# Patient Record
Sex: Female | Born: 1965 | Race: White | Hispanic: No | Smoking: Current every day smoker
Health system: Southern US, Community
[De-identification: ages and names within clinical notes are randomized; demographics above are authoritative.]

## PROBLEM LIST (undated history)

## (undated) DIAGNOSIS — K8010 Calculus of gallbladder with chronic cholecystitis without obstruction: Secondary | ICD-10-CM

## (undated) DIAGNOSIS — K8050 Calculus of bile duct without cholangitis or cholecystitis without obstruction: Secondary | ICD-10-CM

## (undated) DIAGNOSIS — K8020 Calculus of gallbladder without cholecystitis without obstruction: Secondary | ICD-10-CM

## (undated) DIAGNOSIS — M1990 Unspecified osteoarthritis, unspecified site: Secondary | ICD-10-CM

## (undated) DIAGNOSIS — I10 Essential (primary) hypertension: Secondary | ICD-10-CM

## (undated) DIAGNOSIS — I499 Cardiac arrhythmia, unspecified: Secondary | ICD-10-CM

## (undated) DIAGNOSIS — F410 Panic disorder [episodic paroxysmal anxiety] without agoraphobia: Secondary | ICD-10-CM

## (undated) DIAGNOSIS — F32A Depression, unspecified: Secondary | ICD-10-CM

## (undated) DIAGNOSIS — K219 Gastro-esophageal reflux disease without esophagitis: Secondary | ICD-10-CM

## (undated) DIAGNOSIS — F329 Major depressive disorder, single episode, unspecified: Secondary | ICD-10-CM

## (undated) HISTORY — DX: Calculus of gallbladder with chronic cholecystitis without obstruction: K80.10

## (undated) HISTORY — PX: ABDOMINAL HYSTERECTOMY: SHX81

## (undated) HISTORY — DX: Calculus of bile duct without cholangitis or cholecystitis without obstruction: K80.50

---

## 1998-11-20 ENCOUNTER — Other Ambulatory Visit: Admission: RE | Admit: 1998-11-20 | Discharge: 1998-11-20 | Payer: Self-pay | Admitting: Obstetrics and Gynecology

## 2000-01-05 ENCOUNTER — Other Ambulatory Visit: Admission: RE | Admit: 2000-01-05 | Discharge: 2000-01-05 | Payer: Self-pay | Admitting: Obstetrics and Gynecology

## 2001-02-17 ENCOUNTER — Other Ambulatory Visit: Admission: RE | Admit: 2001-02-17 | Discharge: 2001-02-17 | Payer: Self-pay | Admitting: *Deleted

## 2002-03-28 ENCOUNTER — Other Ambulatory Visit: Admission: RE | Admit: 2002-03-28 | Discharge: 2002-03-28 | Payer: Self-pay | Admitting: Obstetrics and Gynecology

## 2003-04-10 ENCOUNTER — Other Ambulatory Visit: Admission: RE | Admit: 2003-04-10 | Discharge: 2003-04-10 | Payer: Self-pay | Admitting: Obstetrics and Gynecology

## 2004-04-23 ENCOUNTER — Other Ambulatory Visit: Admission: RE | Admit: 2004-04-23 | Discharge: 2004-04-23 | Payer: Self-pay | Admitting: Obstetrics and Gynecology

## 2005-03-16 ENCOUNTER — Ambulatory Visit (HOSPITAL_COMMUNITY): Admission: RE | Admit: 2005-03-16 | Discharge: 2005-03-16 | Payer: Self-pay | Admitting: Internal Medicine

## 2005-05-21 ENCOUNTER — Other Ambulatory Visit: Admission: RE | Admit: 2005-05-21 | Discharge: 2005-05-21 | Payer: Self-pay | Admitting: Obstetrics and Gynecology

## 2005-12-07 DIAGNOSIS — I10 Essential (primary) hypertension: Secondary | ICD-10-CM | POA: Insufficient documentation

## 2006-07-01 ENCOUNTER — Other Ambulatory Visit: Admission: RE | Admit: 2006-07-01 | Discharge: 2006-07-01 | Payer: Self-pay | Admitting: Obstetrics & Gynecology

## 2006-09-16 ENCOUNTER — Other Ambulatory Visit: Admission: RE | Admit: 2006-09-16 | Discharge: 2006-09-16 | Payer: Self-pay | Admitting: Obstetrics and Gynecology

## 2006-09-16 ENCOUNTER — Encounter (INDEPENDENT_AMBULATORY_CARE_PROVIDER_SITE_OTHER): Payer: Self-pay | Admitting: Specialist

## 2006-09-16 ENCOUNTER — Ambulatory Visit: Payer: Self-pay | Admitting: Obstetrics and Gynecology

## 2006-12-07 DIAGNOSIS — N63 Unspecified lump in unspecified breast: Secondary | ICD-10-CM

## 2007-09-08 ENCOUNTER — Other Ambulatory Visit: Admission: RE | Admit: 2007-09-08 | Discharge: 2007-09-08 | Payer: Self-pay | Admitting: Obstetrics and Gynecology

## 2007-10-13 ENCOUNTER — Ambulatory Visit: Payer: Self-pay | Admitting: Obstetrics & Gynecology

## 2007-10-17 ENCOUNTER — Ambulatory Visit (HOSPITAL_COMMUNITY): Admission: RE | Admit: 2007-10-17 | Discharge: 2007-10-17 | Payer: Self-pay | Admitting: Family Medicine

## 2007-10-27 ENCOUNTER — Ambulatory Visit: Payer: Self-pay | Admitting: Obstetrics & Gynecology

## 2007-10-28 ENCOUNTER — Ambulatory Visit (HOSPITAL_COMMUNITY): Admission: RE | Admit: 2007-10-28 | Discharge: 2007-10-28 | Payer: Self-pay | Admitting: Obstetrics and Gynecology

## 2007-11-02 ENCOUNTER — Encounter: Admission: RE | Admit: 2007-11-02 | Discharge: 2007-11-02 | Payer: Self-pay | Admitting: Obstetrics and Gynecology

## 2007-11-09 ENCOUNTER — Ambulatory Visit: Payer: Self-pay | Admitting: Obstetrics & Gynecology

## 2007-11-23 ENCOUNTER — Encounter (INDEPENDENT_AMBULATORY_CARE_PROVIDER_SITE_OTHER): Payer: Self-pay | Admitting: Diagnostic Radiology

## 2007-11-23 ENCOUNTER — Encounter: Admission: RE | Admit: 2007-11-23 | Discharge: 2007-11-23 | Payer: Self-pay | Admitting: Internal Medicine

## 2007-12-28 ENCOUNTER — Ambulatory Visit: Payer: Self-pay | Admitting: Gynecology

## 2008-02-10 ENCOUNTER — Ambulatory Visit: Payer: Self-pay | Admitting: Gynecology

## 2008-03-19 DIAGNOSIS — K219 Gastro-esophageal reflux disease without esophagitis: Secondary | ICD-10-CM

## 2008-04-06 LAB — CONVERTED CEMR LAB

## 2008-04-09 ENCOUNTER — Ambulatory Visit: Payer: Self-pay | Admitting: Gynecology

## 2008-04-09 ENCOUNTER — Encounter (INDEPENDENT_AMBULATORY_CARE_PROVIDER_SITE_OTHER): Payer: Self-pay | Admitting: Gynecology

## 2008-04-09 ENCOUNTER — Ambulatory Visit (HOSPITAL_COMMUNITY): Admission: RE | Admit: 2008-04-09 | Discharge: 2008-04-10 | Payer: Self-pay | Admitting: Gynecology

## 2008-05-11 ENCOUNTER — Ambulatory Visit: Payer: Self-pay | Admitting: Gynecology

## 2008-07-10 ENCOUNTER — Emergency Department (HOSPITAL_COMMUNITY): Admission: EM | Admit: 2008-07-10 | Discharge: 2008-07-10 | Payer: Self-pay | Admitting: Emergency Medicine

## 2008-07-17 DIAGNOSIS — E785 Hyperlipidemia, unspecified: Secondary | ICD-10-CM | POA: Insufficient documentation

## 2008-10-04 ENCOUNTER — Emergency Department (HOSPITAL_COMMUNITY): Admission: EM | Admit: 2008-10-04 | Discharge: 2008-10-04 | Payer: Self-pay | Admitting: Emergency Medicine

## 2008-10-17 ENCOUNTER — Ambulatory Visit: Payer: Self-pay | Admitting: Nurse Practitioner

## 2008-10-17 DIAGNOSIS — F17200 Nicotine dependence, unspecified, uncomplicated: Secondary | ICD-10-CM

## 2008-10-17 DIAGNOSIS — F410 Panic disorder [episodic paroxysmal anxiety] without agoraphobia: Secondary | ICD-10-CM | POA: Insufficient documentation

## 2008-10-17 LAB — CONVERTED CEMR LAB
Albumin: 4 g/dL (ref 3.5–5.2)
Alkaline Phosphatase: 43 units/L (ref 39–117)
BUN: 13 mg/dL (ref 6–23)
CO2: 25 meq/L (ref 19–32)
Eosinophils Absolute: 0.1 10*3/uL (ref 0.0–0.7)
Eosinophils Relative: 1 % (ref 0–5)
Glucose, Bld: 85 mg/dL (ref 70–99)
HCT: 42.8 % (ref 36.0–46.0)
Lymphocytes Relative: 41 % (ref 12–46)
Lymphs Abs: 3 10*3/uL (ref 0.7–4.0)
MCV: 103.9 fL — ABNORMAL HIGH (ref 78.0–100.0)
Monocytes Relative: 8 % (ref 3–12)
Potassium: 3.7 meq/L (ref 3.5–5.3)
RBC: 4.12 M/uL (ref 3.87–5.11)
Sodium: 141 meq/L (ref 135–145)
TSH: 0.59 microintl units/mL (ref 0.350–4.50)
Total Bilirubin: 0.5 mg/dL (ref 0.3–1.2)
Total Protein: 6.2 g/dL (ref 6.0–8.3)
WBC: 7.3 10*3/uL (ref 4.0–10.5)

## 2008-10-19 ENCOUNTER — Encounter (INDEPENDENT_AMBULATORY_CARE_PROVIDER_SITE_OTHER): Payer: Self-pay | Admitting: Nurse Practitioner

## 2008-10-24 ENCOUNTER — Encounter (INDEPENDENT_AMBULATORY_CARE_PROVIDER_SITE_OTHER): Payer: Self-pay | Admitting: Nurse Practitioner

## 2008-10-31 ENCOUNTER — Encounter (INDEPENDENT_AMBULATORY_CARE_PROVIDER_SITE_OTHER): Payer: Self-pay | Admitting: Nurse Practitioner

## 2008-11-13 ENCOUNTER — Encounter (INDEPENDENT_AMBULATORY_CARE_PROVIDER_SITE_OTHER): Payer: Self-pay | Admitting: Nurse Practitioner

## 2008-11-16 ENCOUNTER — Encounter (INDEPENDENT_AMBULATORY_CARE_PROVIDER_SITE_OTHER): Payer: Self-pay | Admitting: Nurse Practitioner

## 2008-12-10 ENCOUNTER — Ambulatory Visit: Payer: Self-pay | Admitting: Nurse Practitioner

## 2008-12-10 DIAGNOSIS — I4949 Other premature depolarization: Secondary | ICD-10-CM | POA: Insufficient documentation

## 2008-12-10 LAB — CONVERTED CEMR LAB
Cholesterol, target level: 200 mg/dL
LDL Goal: 130 mg/dL

## 2009-02-07 ENCOUNTER — Encounter (INDEPENDENT_AMBULATORY_CARE_PROVIDER_SITE_OTHER): Payer: Self-pay | Admitting: Nurse Practitioner

## 2009-02-07 ENCOUNTER — Ambulatory Visit: Payer: Self-pay | Admitting: *Deleted

## 2009-02-07 ENCOUNTER — Ambulatory Visit: Payer: Self-pay | Admitting: Nurse Practitioner

## 2009-02-07 DIAGNOSIS — B373 Candidiasis of vulva and vagina: Secondary | ICD-10-CM

## 2009-02-07 DIAGNOSIS — M26609 Unspecified temporomandibular joint disorder, unspecified side: Secondary | ICD-10-CM | POA: Insufficient documentation

## 2009-02-08 ENCOUNTER — Encounter (INDEPENDENT_AMBULATORY_CARE_PROVIDER_SITE_OTHER): Payer: Self-pay | Admitting: Nurse Practitioner

## 2009-04-20 ENCOUNTER — Emergency Department (HOSPITAL_COMMUNITY): Admission: EM | Admit: 2009-04-20 | Discharge: 2009-04-20 | Payer: Self-pay | Admitting: Family Medicine

## 2009-05-28 ENCOUNTER — Telehealth (INDEPENDENT_AMBULATORY_CARE_PROVIDER_SITE_OTHER): Payer: Self-pay | Admitting: Nurse Practitioner

## 2009-09-10 ENCOUNTER — Encounter (INDEPENDENT_AMBULATORY_CARE_PROVIDER_SITE_OTHER): Payer: Self-pay | Admitting: Nurse Practitioner

## 2010-01-02 ENCOUNTER — Encounter (INDEPENDENT_AMBULATORY_CARE_PROVIDER_SITE_OTHER): Payer: Self-pay | Admitting: Nurse Practitioner

## 2010-02-28 ENCOUNTER — Telehealth (INDEPENDENT_AMBULATORY_CARE_PROVIDER_SITE_OTHER): Payer: Self-pay | Admitting: Nurse Practitioner

## 2010-07-31 ENCOUNTER — Ambulatory Visit: Payer: Self-pay | Admitting: Nurse Practitioner

## 2010-07-31 DIAGNOSIS — M545 Low back pain: Secondary | ICD-10-CM | POA: Insufficient documentation

## 2010-07-31 DIAGNOSIS — M6740 Ganglion, unspecified site: Secondary | ICD-10-CM | POA: Insufficient documentation

## 2010-07-31 LAB — CONVERTED CEMR LAB
ALT: 14 units/L (ref 0–35)
Alkaline Phosphatase: 66 units/L (ref 39–117)
Basophils Absolute: 0 10*3/uL (ref 0.0–0.1)
CO2: 27 meq/L (ref 19–32)
Creatinine, Ser: 0.69 mg/dL (ref 0.40–1.20)
Eosinophils Absolute: 0.1 10*3/uL (ref 0.0–0.7)
Eosinophils Relative: 1 % (ref 0–5)
HCT: 43.9 % (ref 36.0–46.0)
Hemoglobin: 14.9 g/dL (ref 12.0–15.0)
Lymphocytes Relative: 34 % (ref 12–46)
MCHC: 33.9 g/dL (ref 30.0–36.0)
MCV: 100.9 fL — ABNORMAL HIGH (ref 78.0–100.0)
Monocytes Absolute: 0.5 10*3/uL (ref 0.1–1.0)
Platelets: 256 10*3/uL (ref 150–400)
RDW: 13.3 % (ref 11.5–15.5)
TSH: 1.484 microintl units/mL (ref 0.350–4.500)
Total Bilirubin: 0.4 mg/dL (ref 0.3–1.2)

## 2010-08-01 ENCOUNTER — Encounter (INDEPENDENT_AMBULATORY_CARE_PROVIDER_SITE_OTHER): Payer: Self-pay | Admitting: Nurse Practitioner

## 2010-10-28 ENCOUNTER — Ambulatory Visit: Payer: Self-pay | Admitting: Nurse Practitioner

## 2010-10-28 DIAGNOSIS — H6120 Impacted cerumen, unspecified ear: Secondary | ICD-10-CM

## 2010-10-29 ENCOUNTER — Encounter (INDEPENDENT_AMBULATORY_CARE_PROVIDER_SITE_OTHER): Payer: Self-pay | Admitting: Nurse Practitioner

## 2010-11-19 ENCOUNTER — Ambulatory Visit (HOSPITAL_COMMUNITY)
Admission: RE | Admit: 2010-11-19 | Discharge: 2010-11-19 | Payer: Self-pay | Source: Home / Self Care | Attending: Internal Medicine | Admitting: Internal Medicine

## 2010-11-26 ENCOUNTER — Encounter (INDEPENDENT_AMBULATORY_CARE_PROVIDER_SITE_OTHER): Payer: Self-pay | Admitting: Nurse Practitioner

## 2010-12-05 ENCOUNTER — Encounter
Admission: RE | Admit: 2010-12-05 | Discharge: 2010-12-05 | Payer: Self-pay | Source: Home / Self Care | Attending: Internal Medicine | Admitting: Internal Medicine

## 2010-12-28 ENCOUNTER — Encounter: Payer: Self-pay | Admitting: Internal Medicine

## 2011-01-04 LAB — CONVERTED CEMR LAB
Bilirubin Urine: NEGATIVE
Blood in Urine, dipstick: NEGATIVE
Chlamydia, DNA Probe: NEGATIVE
Cholesterol: 202 mg/dL — ABNORMAL HIGH (ref 0–200)
GC Probe Amp, Genital: NEGATIVE
Glucose, Urine, Semiquant: NEGATIVE
HDL: 43 mg/dL (ref >39)
Ketones, urine, test strip: NEGATIVE
LDL Cholesterol: 140 mg/dL — ABNORMAL HIGH (ref 0–99)
Nitrite: NEGATIVE
Pap Smear: NEGATIVE
Protein, U semiquant: NEGATIVE
Protein, U semiquant: NEGATIVE
Specific Gravity, Urine: >=1.03
Total CHOL/HDL Ratio: 4.7
Triglycerides: 96 mg/dL (ref <150)
Urobilinogen, UA: 0.2
VLDL: 19 mg/dL (ref 0–40)
WBC Urine, dipstick: NEGATIVE
pH: 5.5

## 2011-01-08 NOTE — Letter (Signed)
Summary: Lipid Letter  [facility]  [address] [place], [place] [address]   Phone: [phone]  Fax: [phone]    10/29/2010  [name] [address] [place], [place]  [address]  Dear [name]:  We have carefully reviewed your last lipid profile from 10/28/2010 and the results are noted below with a summary of recommendations for lipid management.    Cholesterol:       233     Goal: less than 200   HDL "good" Cholesterol:   35     Goal: greater than 40   LDL "bad" Cholesterol:   167     Goal: less than 1430   Triglycerides:       155     Goal: less than 150    Labs done during recent office visit show that your cholesterol is high. You should have spoken with this office about the need to start medications.  You will need labs rechecked in 8 weeks after starting medications  Pap Smear results __________________.    Current Medications: 1)    Lisinopril-hydrochlorothiazide 20-25 Mg Tabs (Lisinopril-hydrochlorothiazide) .... Take one tablet by mouth daily for blood pressure 2)    Atenolol 50 Mg Tabs (Atenolol) .Marland Kitchen.. 1 tablet by mouth daily for blood pressure 3)    Protonix 40 Mg Tbec (Pantoprazole sodium) .... One tablet by mouth daily for stomach 4)    Alprazolam 1 Mg Tabs (Alprazolam) .... Take 1/2 tablet by mouth three times a day as needed for nerves 5)    Lexapro 10 Mg Tabs (Escitalopram oxalate) .Marland Kitchen.. 1 tablet by mouth daily for mood 6)    Meloxicam 15 Mg Tabs (Meloxicam) .... One tablet by mouth daily for joint 7)    Pravastatin Sodium 20 Mg Tabs (Pravastatin sodium) .... One tablet by mouth nightly for cholesterol  If you have any questions, please call. We appreciate being able to work with you.   Sincerely,    [facility] Lehman Prom FNP

## 2011-01-08 NOTE — Letter (Signed)
Summary: VACCINE ADMINISTRATION RECORD  VACCINE ADMINISTRATION RECORD   Imported By: Arta Bruce 01/27/2010 15:41:46  _____________________________________________________________________  External Attachment:    Type:   Image     Comment:   External Document

## 2011-01-08 NOTE — Letter (Signed)
Summary: *HSN Results Follow up  [facility]  [address] [place], [place] [address]   Phone: [phone]  Fax: [phone]      08/01/2010   [name] [address] [place], [place]  [address]   Dear  [name],                            ____S.Drinkard,FNP   ____D. Gore,FNP       ____B. McPherson,MD   ____V. Rankins,MD    ____E. Mulberry,MD    __X__[name], FNP  ____D. Reche Dixon, MD    ____K. Philipp Deputy, MD    ____Other     This letter is to inform you that your recent test(s):  _______Pap Smear    ___X____Lab Test     _______X-ray    ___X____ is within acceptable limits  _______ requires a medication change  _______ requires a follow-up lab visit  _______ requires a follow-up visit with your provider   Comments: Labs done during recent office visit are normal.       _________________________________________________________ If you have any questions, please contact our office [phone].                    Sincerely,    Lehman Prom FNP [facility]

## 2011-01-08 NOTE — Miscellaneous (Signed)
Summary: Flu vaccine  Clinical Lists Changes  Observations: Added new observation of FLU VAX: historical (09/10/2009 8:42)

## 2011-01-08 NOTE — Progress Notes (Signed)
Summary: Office Visit//DEPRESSION SCREENING  Office Visit//DEPRESSION SCREENING   Imported By: Arta Bruce 10/29/2010 09:39:32  _____________________________________________________________________  External Attachment:    Type:   Image     Comment:   External Document

## 2011-01-08 NOTE — Assessment & Plan Note (Signed)
Summary: HTN/Anxiety   Vital Signs:  Patient profile:   45 year old female Menstrual status:  hysterectomy Weight:      249.0 pounds BMI:     38.56 Temp:     97.0 degrees F oral Pulse rate:   108 / minute Pulse rhythm:   regular Resp:     20 per minute BP sitting:   130 / 90  (left arm) Cuff size:   large  Vitals Entered By: Levon Hedger (July 31, 2010 3:32 PM)  Nutrition Counseling: Patient's BMI is greater than 25 and therefore counseled on weight management options. CC: renewing medication...pain in wrist x 2 -3 months, Hypertension Management, Lipid Management, Abdominal Pain, Depression Is Patient Diabetic? No Pain Assessment Patient in pain? no       Does patient need assistance? Functional Status Self care Ambulation Normal LMP - Character: hysterectomy     Menstrual Status hysterectomy Last PAP Result  Specimen Adequacy: Satisfactory for evaluation.   Interpretation/Result:Negative for intraepithelial Lesion or Malignancy.   Interpretation/Result:Fungal organisms c/w Candida present.       CC:  renewing medication...pain in wrist x 2 -3 months, Hypertension Management, Lipid Management, Abdominal Pain, and Depression.  History of Present Illness:  Pt into the office for f/u after not having been in this office in over 1 year. Her card has expired and she has finally gotten it renewed.  Social - Pt is recently separated from her husband who moved back to [place].  She is however in another relationship and may be planning to move to [place] with him in the future. Comments:  Admits that she was taking a family members medication since she was unable to get her medications from this office.  Depression Treatment History:  Prior Medication Used:   Start Date: Assessment of Effect:   Comments:  lexapro     07/31/2010   much improvement     restarted  Dyspepsia History:      She has no alarm features of dyspepsia including no history of melena,  hematochezia, dysphagia, persistent vomiting, or involuntary weight loss > 5%.  There is a prior history of GERD.  The patient does not have a prior history of documented ulcer disease.  The dominant symptom is heartburn or acid reflux.  An H-2 blocker medication is not currently being taken.  She has no history of a positive H. Pylori serology.  No previous upper endoscopy has been done.    Hypertension History:      She denies headache, chest pain, and palpitations.  She notes no problems with any antihypertensive medication side effects.        Positive major cardiovascular risk factors include hyperlipidemia, hypertension, and current tobacco user.  Negative major cardiovascular risk factors include female age less than 63 years old, no history of diabetes, and negative family history for ischemic heart disease.        Further assessment for target organ damage reveals no history of ASHD, stroke/TIA, peripheral vascular disease, renal insufficiency, or hypertensive retinopathy.    Lipid Management History:      Positive NCEP/ATP III risk factors include current tobacco user and hypertension.  Negative NCEP/ATP III risk factors include female age less than 6 years old, non-diabetic, no family history for ischemic heart disease, no ASHD (atherosclerotic heart disease), no prior stroke/TIA, no peripheral vascular disease, and no history of aortic aneurysm.        The patient states that she does not know about the "  Therapeutic Lifestyle Change" diet.        Habits & Providers  Alcohol-Tobacco-Diet     Alcohol drinks/day: 0     Tobacco Status: current     Cigarette Packs/Day: 1.0     Year Started: age 82  Exercise-Depression-Behavior     Does Patient Exercise: yes     Type of exercise: walking     Drug Use: no     Seat Belt Use: 100     Sun Exposure: occasionally  Allergies (verified): No Known Drug Allergies  Social History: Packs/Day:  1.0  Review of Systems General:  Denies  fever. CV:  Denies chest pain or discomfort. Resp:  Complains of cough; due to smoking. GI:  Complains of indigestion. GU:  Denies discharge. 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  Noticed a "little ball" on her wrist from time to time that is tender. Neuro:  Denies headaches.  Physical Exam  General:  alert, obese Head:  normocephalic.   Ears:  ear piercing(s) noted.   Lungs:  decreased bases Heart:  normal rate and regular rhythm.   Msk:  right shift - ganglion cyst Neurologic:  alert & oriented X3.     Impression & Recommendations:  Problem # 1:  HYPERTENSION, BENIGN ESSENTIAL (ICD-401.1) BP is stable reviewed DASH diet Her updated medication list for this problem includes:    Lisinopril-hydrochlorothiazide 20-25 Mg Tabs (Lisinopril-hydrochlorothiazide) .Marland Kitchen... Take one tablet by mouth daily for blood pressure    Atenolol 50 Mg Tabs (Atenolol) .Marland Kitchen... 1 tablet by mouth daily for blood pressure  Orders: Rapid HIV  ([address]) T-Comprehensive Metabolic Panel (82956-21308) T-CBC w/Diff (65784-69629) T-TSH (52841-32440)  Problem # 2:  PANIC DISORDER (ICD-300.01)  Her updated medication list for this problem includes:    Alprazolam 1 Mg Tabs (Alprazolam) .Marland Kitchen... Take 1/2 tablet by mouth three times a day as needed for nerves    Lexapro 10 Mg Tabs (Escitalopram oxalate) .Marland Kitchen... 1 tablet by mouth daily for mood  Problem # 3:  TOBACCO ABUSE (ICD-305.1) continued to advise cessation  Problem # 4:  GANGLION CYST, WRIST, RIGHT (ICD-727.41) advised pt of the dx  Complete Medication List: 1)  Lisinopril-hydrochlorothiazide 20-25 Mg Tabs (Lisinopril-hydrochlorothiazide) .... Take one tablet by mouth daily for blood pressure 2)  Atenolol 50 Mg Tabs (Atenolol) .Marland Kitchen.. 1 tablet by mouth daily for blood pressure 3)  Protonix 40 Mg Tbec (Pantoprazole sodium) .... One tablet by mouth daily for stomach 4)  Alprazolam 1 Mg Tabs (Alprazolam)  .... Take 1/2 tablet by mouth three times a day as needed for nerves 5)  Lexapro 10 Mg Tabs (Escitalopram oxalate) .Marland Kitchen.. 1 tablet by mouth daily for mood 6)  Meloxicam 15 Mg Tabs (Meloxicam) .... One tablet by mouth daily for joint  Hypertension Assessment/Plan:      The patient's hypertensive risk group is category B: At least one risk factor (excluding diabetes) with no target organ damage.  Her calculated 10 year risk of coronary heart disease is 11 %.  Today's blood pressure is 130/90.  Her blood pressure goal is < 140/90.  Lipid Assessment/Plan:      Based on NCEP/ATP III, the patient's risk factor category is "0-1 risk factors".  The patient's lipid goals are as follows: Total cholesterol goal is 200; LDL cholesterol goal is 130; HDL cholesterol goal is 40; Triglyceride goal is 150.    Patient Instructions: 1)  Your labs will be checked today. 2)  Schedule an appointment for a complete physical exam 3)  Come fasting for labs - no food after midnight before this visit. 4)  will need lipids, u/a, microalbumin, PAP, mammogram, EKG 5)  Lexapro and protonix will be sent to [facility] Prescriptions: ALPRAZOLAM 1 MG TABS (ALPRAZOLAM) Take 1/2 tablet by mouth three times a day as needed for nerves  #60 x 0   Entered and Authorized by:   Lehman Prom FNP   Signed by:   Lehman Prom FNP on 07/31/2010   Method used:   Printed then faxed to ...       [facility] (retail)       [address] [place], [place]  [address]       Ph: [phone]       Fax: [phone]   RxID:   [id number] LEXAPRO 10 MG TABS (ESCITALOPRAM OXALATE) 1 tablet by mouth daily for mood  #30 x 11   Entered and Authorized by:   Lehman Prom FNP   Signed by:   Lehman Prom FNP on 07/31/2010   Method used:   Faxed to ...       [facility] (retail)       [address] [place], [place]  [address]       Ph:  [phone]       Fax: [phone]   RxID:   [id number] PROTONIX 40 MG TBEC (PANTOPRAZOLE SODIUM) One tablet by mouth daily for stomach  #30 x 11   Entered and Authorized by:   Lehman Prom FNP   Signed by:   Lehman Prom FNP on 07/31/2010   Method used:   Faxed to ...       [facility] (retail)       [address] [place], [place]  [address]       Ph: [phone]       Fax: [phone]   RxID:   [id number] MELOXICAM 15 MG TABS (MELOXICAM) One tablet by mouth daily for joint  #30 x 5   Entered and Authorized by:   Lehman Prom FNP   Signed by:   Lehman Prom FNP on 07/31/2010   Method used:   Print then Give to Patient   RxID:   [id number] ALPRAZOLAM 1 MG TABS (ALPRAZOLAM) Take 1/2 tablet by mouth three times a day as needed for nerves  #60 x 0   Entered and Authorized by:   Lehman Prom FNP   Signed by:   Lehman Prom FNP on 07/31/2010   Method used:   Print then Give to Patient   RxID:   [id number] ATENOLOL 50 MG TABS (ATENOLOL) 1 tablet by mouth daily for blood pressure  #90 x 1   Entered and Authorized by:   Lehman Prom FNP   Signed by:   Lehman Prom FNP on 07/31/2010   Method used:   Electronically to        [facility] [address] (retail)       [phone]       [place], [place]  [address]       Ph: [phone]       Fax: [phone]   RxID:   [id number] LISINOPRIL-HYDROCHLOROTHIAZIDE 20-25 MG TABS (LISINOPRIL-HYDROCHLOROTHIAZIDE) Take one tablet by mouth daily for blood pressure  #  90 Each x 1   Entered and Authorized by:   Lehman Prom FNP   Signed by:   Lehman Prom FNP on 07/31/2010   Method used:   Electronically to        [facility] [address] (retail)       [phone]       [place], [place]  [address]       Ph: [phone]       Fax: [phone]   RxID:    [id number]   Appended Document: HTN/Anxiety     Allergies: No Known Drug Allergies   Complete Medication List: 1)  Lisinopril-hydrochlorothiazide 20-25 Mg Tabs (Lisinopril-hydrochlorothiazide) .... Take one tablet by mouth daily for blood pressure 2)  Atenolol 50 Mg Tabs (Atenolol) .Marland Kitchen.. 1 tablet by mouth daily for blood pressure 3)  Protonix 40 Mg Tbec (Pantoprazole sodium) .... One tablet by mouth daily for stomach 4)  Alprazolam 1 Mg Tabs (Alprazolam) .... Take 1/2 tablet by mouth three times a day as needed for nerves 5)  Lexapro 10 Mg Tabs (Escitalopram oxalate) .Marland Kitchen.. 1 tablet by mouth daily for mood 6)  Meloxicam 15 Mg Tabs (Meloxicam) .... One tablet by mouth daily for joint   Laboratory Results  Date/Time Received: August 01, 2010 12:49 PM   Other Tests  Rapid HIV: negative

## 2011-01-08 NOTE — Assessment & Plan Note (Signed)
Summary: Complete Physical Exam   Vital Signs:  Patient profile:   45 year old female Menstrual status:  hysterectomy Height:      67.50 inches Weight:      242.3 pounds Temp:     97.2 degrees F oral Pulse rate:   72 / minute Pulse rhythm:   regular Resp:     16 per minute BP sitting:   120 / 78  (left arm) Cuff size:   large  Vitals Entered By: Levon Hedger (October 28, 2010 10:55 AM) CC: CPP Is Patient Diabetic? No Pain Assessment Patient in pain? no       Does patient need assistance? Functional Status Self care Ambulation Normal   CC:  CPP.  History of Present Illness:  Pt into the office for a complete physical exam  PAP - last done in 2009 s/p partial  hysterecomy.  All previous PAP Smears have been normal. No cervical or ovarian cancer in the family.  Mammogram - last done in 2008 no family history of breast cancer no self breast exams at home  Optho - no recent optho exam. wears reading glasses from [employer]  Dental - no recent dental exam.   Obesity - down 7 pounds since last visit.  " i am not trying just eating less"  Habits & Providers  Alcohol-Tobacco-Diet     Alcohol drinks/day: 0     Tobacco Status: current     Tobacco Counseling: to quit use of tobacco products     Cigarette Packs/Day: 1.0     Year Started: age 1  Exercise-Depression-Behavior     Does Patient Exercise: yes     Type of exercise: walking     Have you felt down or hopeless? no     Have you felt little pleasure in things? no     Depression Counseling: not indicated; screening negative for depression     Drug Use: no     Seat Belt Use: 100     Sun Exposure: occasionally  Comments: PHQ-9 score = 6  Allergies (verified): No Known Drug Allergies  Review of Systems General:  Denies fever. Eyes:  Denies blurring. ENT:  Denies earache. CV:  Denies fatigue. Resp:  Denies cough. GI:  Denies abdominal pain, nausea, and vomiting. GU:  Denies discharge. MS:   Denies joint pain. Derm:  Denies itching. Neuro:  Denies headaches. Psych:  Denies depression.  Physical Exam  General:  alert.   Head:  normocephalic.   Eyes:  pupils equal, pupils round, and pupils reactive to light.   Ears:  ear piercing(s) noted.   right ear with moderate cerumen - soft left ear - able to see TM , clear fluid Nose:  no nasal discharge.   Mouth:  pharynx pink and moist and poor dentition.   Neck:  supple.   Chest Wall:  no mass.   Breasts:  no masses and no abnormal thickening.   Lungs:  normal breath sounds.   Heart:  normal rate and regular rhythm.   Abdomen:  obese Rectal:  no external abnormalities.   Genitalia:  right labia with cyst Msk:  up to the exam table Extremities:  no edema Neurologic:  alert & oriented X3, cranial nerves II-XII intact, and gait normal.   Skin:  color normal.   varicosities in bil lower ext Psych:  Oriented X3.    Pelvic Exam  Vulva:      normal appearance.   Urethra and Bladder:  Urethra--no discharge.  Bladder--normal.   Vagina:      physiologic discharge.   Cervix:      absent Uterus:      smooth.   Adnexa:      nontender bilaterally.   Rectum:      normal, heme negative stool.      Impression & Recommendations:  Problem # 1:  ROUTINE GYNECOLOGICAL EXAMINATION (ICD-V72.31) PAP done labs reviewed from previous visit rec optho and dental exam Orders: Hemoccult Guaiac-1 spec.(in office) (82270)  Problem # 2:  OTHER SCREENING BREAST EXAMINATION (ICD-V76.19) self breast exam placcard given to pt mammogram scheduled Orders: Mammogram (Screening) (Mammo)  Problem # 3:  HYPERLIPIDEMIA (ICD-272.4)  Orders: T-Lipid Profile (27253-66440)  Problem # 4:  HYPERTENSION, BENIGN ESSENTIAL (ICD-401.1)  Her updated medication list for this problem includes:    Lisinopril-hydrochlorothiazide 20-25 Mg Tabs (Lisinopril-hydrochlorothiazide) .Marland Kitchen... Take one tablet by mouth daily for blood pressure    Atenolol 50  Mg Tabs (Atenolol) .Marland Kitchen... 1 tablet by mouth daily for blood pressure  Orders: UA Dipstick w/o Micro (manual) (34742) EKG w/ Interpretation (93000)  Problem # 5:  TOBACCO ABUSE (ICD-305.1) advise cessation  Problem # 6:  CERUMEN IMPACTION, RIGHT (ICD-380.4)  irrigation done today in office  Orders: Cerumen Impaction Removal (59563)  Complete Medication List: 1)  Lisinopril-hydrochlorothiazide 20-25 Mg Tabs (Lisinopril-hydrochlorothiazide) .... Take one tablet by mouth daily for blood pressure 2)  Atenolol 50 Mg Tabs (Atenolol) .Marland Kitchen.. 1 tablet by mouth daily for blood pressure 3)  Protonix 40 Mg Tbec (Pantoprazole sodium) .... One tablet by mouth daily for stomach 4)  Alprazolam 1 Mg Tabs (Alprazolam) .... Take 1/2 tablet by mouth three times a day as needed for nerves 5)  Lexapro 10 Mg Tabs (Escitalopram oxalate) .Marland Kitchen.. 1 tablet by mouth daily for mood 6)  Meloxicam 15 Mg Tabs (Meloxicam) .... One tablet by mouth daily for joint  Patient Instructions: 1)  Your labs will be checked and you will be notified of the results 2)  Keep your appointment for a mammogram 3)  Your ear has been cleaned today.  Remember to not put anything in your ears. 4)  Follow up in 6 months or sooner if necessary.   Orders Added: 1)  T-Lipid Profile [80061-22930] 2)  Hemoccult Guaiac-1 spec.(in office) [82270] 3)  UA Dipstick w/o Micro (manual) [81002] 4)  EKG w/ Interpretation [93000] 5)  Est. Patient age 24-64 [69] 6)  Mammogram (Screening) [Mammo] 7)  Cerumen Impaction Removal [69210]    Laboratory Results   Urine Tests  Date/Time Received: October 28, 2010 11:39 AM   Routine Urinalysis   Color: lt. yellow Appearance: Clear Glucose: negative   (Normal Range: Negative) Bilirubin: negative   (Normal Range: Negative) Ketone: negative   (Normal Range: Negative) Spec. Gravity: >=1.030   (Normal Range: 1.003-1.035) Blood: negative   (Normal Range: Negative) pH: 5.5   (Normal Range:  5.0-8.0) Protein: negative   (Normal Range: Negative) Urobilinogen: 0.2   (Normal Range: 0-1) Nitrite: negative   (Normal Range: Negative) Leukocyte Esterace: negative   (Normal Range: Negative)    Date/Time Received: October 28, 2010 12:19 PM   [place]/KOH Source: vaginal WBC/hpf: 1-5 Bacteria/hpf: rare Clue cells/hpf: none Yeast/hpf: none Trichomonas/hpf: none  Stool - Occult Blood Hemmoccult #1: negative Date: 10/28/2010      Laboratory Results   Urine Tests    Routine Urinalysis   Color: lt. yellow Appearance: Clear Glucose: negative   (Normal Range: Negative) Bilirubin: negative   (Normal Range:  Negative) Ketone: negative   (Normal Range: Negative) Spec. Gravity: >=1.030   (Normal Range: 1.003-1.035) Blood: negative   (Normal Range: Negative) pH: 5.5   (Normal Range: 5.0-8.0) Protein: negative   (Normal Range: Negative) Urobilinogen: 0.2   (Normal Range: 0-1) Nitrite: negative   (Normal Range: Negative) Leukocyte Esterace: negative   (Normal Range: Negative)      [place] KOH: Negative    Prevention & Chronic Care Immunizations   Influenza vaccine: given at work  (10/07/2010)    Tetanus booster: 12/08/2007: historical per pt    Pneumococcal vaccine: Not documented  Other Screening   Pap smear:  Specimen Adequacy: Satisfactory for evaluation.   Interpretation/Result:Negative for intraepithelial Lesion or Malignancy.   Interpretation/Result:Fungal organisms c/w Candida present.      (02/07/2009)   Pap smear action/deferral: PAP smear done  (02/07/2009)    Mammogram: per [facility] program  (10/08/2007)   Mammogram action/deferral: mammogram ordered  (02/07/2009)   Smoking status: current  (10/28/2010)   Smoking cessation counseling: yes  (02/07/2009)  Lipids   Total Cholesterol: 202  (02/07/2009)   LDL: 140  (02/07/2009)   LDL Direct: Not documented   HDL: 43  (02/07/2009)   Triglycerides: 96  (02/07/2009)    SGOT (AST):  12  (07/31/2010)   SGPT (ALT): 14  (07/31/2010)   Alkaline phosphatase: 66  (07/31/2010)   Total bilirubin: 0.4  (07/31/2010)  Hypertension   Last Blood Pressure: 120 / 78  (10/28/2010)   Serum creatinine: 0.69  (07/31/2010)   Serum potassium 4.3  (07/31/2010)  Self-Management Support :    Hypertension self-management support: Not documented    Lipid self-management support: Not documented

## 2011-01-08 NOTE — Progress Notes (Signed)
Summary: Needs office visit  Phone Note Outgoing Call   Summary of Call: notify pt that it has been 1 year since her last visit at which time she had a CPE. she needs to schedule an appt for CPE with labs during the month of April alprazolam refilled for March but since it has been 1 year since her last visit she will need to be seen for further refills Initial call taken by: Lehman Prom FNP,  February 28, 2010 2:24 PM

## 2011-01-08 NOTE — Letter (Signed)
Summary: Handout Printed  Printed Handout:  - Ganglion

## 2011-01-18 ENCOUNTER — Inpatient Hospital Stay (INDEPENDENT_AMBULATORY_CARE_PROVIDER_SITE_OTHER)
Admission: RE | Admit: 2011-01-18 | Discharge: 2011-01-18 | Disposition: A | Discharge status: Home / Self Care | Payer: Self-pay | Source: Ambulatory Visit | Attending: Emergency Medicine | Admitting: Emergency Medicine

## 2011-01-18 DIAGNOSIS — R21 Rash and other nonspecific skin eruption: Secondary | ICD-10-CM

## 2011-01-18 DIAGNOSIS — B354 Tinea corporis: Secondary | ICD-10-CM

## 2011-04-07 ENCOUNTER — Emergency Department (HOSPITAL_COMMUNITY)
Admission: EM | Admit: 2011-04-07 | Discharge: 2011-04-08 | Disposition: A | Discharge status: Home / Self Care | Payer: Self-pay | Attending: Emergency Medicine | Admitting: Emergency Medicine

## 2011-04-07 DIAGNOSIS — I10 Essential (primary) hypertension: Secondary | ICD-10-CM | POA: Insufficient documentation

## 2011-04-07 DIAGNOSIS — W5311XA Bitten by rat, initial encounter: Secondary | ICD-10-CM | POA: Insufficient documentation

## 2011-04-07 DIAGNOSIS — Y929 Unspecified place or not applicable: Secondary | ICD-10-CM | POA: Insufficient documentation

## 2011-04-07 DIAGNOSIS — K219 Gastro-esophageal reflux disease without esophagitis: Secondary | ICD-10-CM | POA: Insufficient documentation

## 2011-04-07 DIAGNOSIS — M79609 Pain in unspecified limb: Secondary | ICD-10-CM | POA: Insufficient documentation

## 2011-04-07 DIAGNOSIS — F411 Generalized anxiety disorder: Secondary | ICD-10-CM | POA: Insufficient documentation

## 2011-04-07 DIAGNOSIS — Z79899 Other long term (current) drug therapy: Secondary | ICD-10-CM | POA: Insufficient documentation

## 2011-04-07 DIAGNOSIS — S61209A Unspecified open wound of unspecified finger without damage to nail, initial encounter: Secondary | ICD-10-CM | POA: Insufficient documentation

## 2011-04-21 NOTE — Group Therapy Note (Signed)
NAME:  [name], PULICE NO.:  [id number]   MEDICAL RECORD NO.:  [id number]          PATIENT TYPE:  WOC   LOCATION:  [facility]                   FACILITY:  [facility]   PHYSICIAN:  Johnella Moloney, MD        DATE OF BIRTH:  Oct 06, 1966   DATE OF SERVICE:                                  CLINIC NOTE   CHIEF COMPLAINT:  Menometrorrhagia.   HISTORY OF PRESENT ILLNESS:  The patient is a 45 year old G0 with a  history of menometrorrhagia. The patient was last seen on October 13, 2007, for evaluation of this menometrorrhagia.  She is here to follow up  results. Of note, the patient was offered the choice regarding the need  for an endometrial biopsy, but the patient declined as an artifical  examination.  She was scheduled for a pelvic ultrasound and laboratory  studies.   PHYSICAL EXAMINATION:  Deferred.   STUDIES:  The patient had a hemoglobin of 15.7,  normal coagulation  studies. TSH was 0.683, Prolactin 14.3. APG negative. A pelvic  ultrasound showed an endometrial strip of 4 mm, normal uterus, normal  adnexa.   These results were discussed with the patient, who was reassured,  however, stressed the need for an endometrial biopsy to rule out any  hyperplasia or neoplasia.. The patient is unwilling to undergo  procedures today, but wants it to be rescheduled for another date.  In  the mean time, the patient continues to have some bleeding. She was  given prescription for Provera 10  mg p.o. b.i.d. for ten days. She will  return for her endometrial biopsy.           ______________________________  Johnella Moloney, MD     UD/MEDQ  D:  10/27/2007  T:  10/28/2007  Job:  [id number]

## 2011-04-21 NOTE — Op Note (Signed)
[name], [name]                ACCOUNT NO.:  [id number]   MEDICAL RECORD NO.:  [id number]          PATIENT TYPE:  OBV   LOCATION:  9318                          [facility]   PHYSICIAN:  Ginger Carne, MD  DATE OF BIRTH:  Aug 07, 1966   DATE OF PROCEDURE:  04/09/2008  DATE OF DISCHARGE:                               OPERATIVE REPORT   PREOPERATIVE DIAGNOSIS:  Menometrorrhagia.   POSTOPERATIVE DIAGNOSIS:  Menometrorrhagia.   PROCEDURE:  Total vaginal hysterectomy with preservation of both tubes  and ovaries.   SURGEON:  Ginger Carne, MD   ASSISTANT:  Javier Glazier. Rose, MD.   ESTIMATED BLOOD LOSS:  50 ml.   ANAESTHESIA:  General   COMPLICATIONS:  None immediate.   SPECIMEN:  Uterus and cervix to pathology.   OPERATIVE FINDINGS:  External genitalia, vulva, and vagina normal.  Cervix smooth without erosions or lesions.  The uterus appeared to be 8-  10 weeks in size.  Both ovaries and tubes appeared normal.   OPERATIVE PROCEDURE:  The patient was prepped and draped in usual  fashion and placed in the lithotomy position.  Betadine solution used  for antiseptic and the patient was catheterized prior to procedure.  After adequate general anesthesia, double-tooth tenaculum placed in the  anterior and posterior lips of the cervix.  Following this, 20 mL of  Marcaine with epinephrine was injected circumferentially around the  cervix.  2 cm of anterior and posterior vaginal epithelium were incised  transversely.  Peritoneal reflection was identified, opened without  injury to their respective organs.  Uterosacral-cardinal ligament  complex was clamped and ligated with 0 Vicryl suture.  This was then  extended to the uterine vasculature in a standard Richardson fashion  with its ascending branches.  At this point, the utero-ovarian ligaments  including tube and the round ligaments were clamped, cut, and ligated  with transfixation 0 Vicryl suture.  Bleeding points  hemostatically  checked.  No active bleeding noted.  Closure of the cuff with  separate angle sutures with 0 Vicryl suture and then running 0 Vicryl  from either end to midline in an interlocking manner.  Bleeding points  hemostatically checked.  No active bleeding noted in the cuff.  More  irrigation utilized removed.  The patient tolerated the procedure well,  returned to post anesthesia recovery room in excellent condition.      Ginger Carne, MD  Electronically Signed     SHB/MEDQ  D:  04/09/2008  T:  04/09/2008  Job:  [id number]

## 2011-04-21 NOTE — Discharge Summary (Signed)
[name], [name]                ACCOUNT NO.:  [id number]   MEDICAL RECORD NO.:  [id number]          PATIENT TYPE:  WOC   LOCATION:  W[facility]                          FACILITY:  [facility]   PHYSICIAN:  Ginger Carne, MD  DATE OF BIRTH:  May 16, 1966   DATE OF ADMISSION:  04/09/2008  DATE OF DISCHARGE:  04/10/2008                               DISCHARGE SUMMARY   REASON FOR HOSPITALIZATION:  Menometrorrhagia.   HOSPITAL PROCEDURE:  Total vaginal hysterectomy with preservation of  both tubes and ovaries.   FINAL DIAGNOSIS:  Menomerorrhagia   HOSPITAL COURSE:  This is a 45 year old Caucasian female who underwent  the aforementioned procedure on Apr 09, 2008.  The patient had been  appropriately worked up prior to said surgery. Intra-operative course  unremarkable.    Postoperatively, the patient had a hemoglobin of 14.0 and a creatinine  of 0.66.  She was afebrile.  Calves without tenderness.  Lungs were  clear.  Abdomen was soft.  She had scant vaginal discharge.   Discharged with routine postoperative instructions including contacting  the office for temperature elevation above 100.4 degrees Fahrenheit,  increasing abdominal pain, vaginal bleeding, GI, GU complaints, or any  other symptomatology.  She was requested to make an appointment at the  GYN clinic to be seen in 4 weeks.   DISCHARGE MEDICATIONS:  Discharge medications include Percocet 5/325 1-2  every 6-8 hours as needed for pain, #40, no refills.  She will continue  her home medications with lisinopril, hydrochlorothiazide 20/25 mg  daily, Pepcid AC over-the-counter as needed, Protonix 40 mg daily,  fluoxetine 20 mg daily, alprazolam 1 mg up to 3 times a day, Adderall  100 mg daily, 1 baby aspirin and vitamin.   All questions answered to the satisfactory of the patient.  The patient  verbalized understanding of same.      Ginger Carne, MD  Electronically Signed     SHB/MEDQ  D:  04/10/2008  T:  04/10/2008   Job:  [id number]

## 2011-04-21 NOTE — H&P (Signed)
[name], [name]                ACCOUNT NO.:  [id number]   MEDICAL RECORD NO.:  [id number]         PATIENT TYPE:  WOBV   LOCATION:                                [facility]   PHYSICIAN:  Ginger Carne, MD  DATE OF BIRTH:  22-May-1966   DATE OF ADMISSION:  04/09/2008  DATE OF DISCHARGE:                              HISTORY & PHYSICAL   REASON FOR HOSPITALIZATION:  Menometrorrhagia.   HISTORY OF PRESENT ILLNESS:  This patient is a 45 year old gravida 0,  para 0 female who has had an over a 2-1/2-year history of  menometrorrhagia.  The patient states that her menses have lasted  anywhere from 2-3 weeks out of a 30-day cycle.  She was evaluated in  2007 with an endometrial biopsy which at that time demonstrated weakly  proliferative endometrium without neoplasia or hyperplasia.  On more  recent ultrasound of the pelvis dated October 17, 2007, demonstrated an  endometrial lining measuring 4 mm with a total uterine dimension  sagittally of 7.3 cm in length.  There was no evidence of adnexal  abnormalities and no evidence of myometrial masses.  The patient had  declined a repeat endometrial biopsy in November 2008.  The patient has  been treated with progesterone-only-pills in the past and this has not  resolved her bleeding.  She states that the bleeding had a negative  impact in her quality of life and at times has kept her from outdoor  activities.  She was offered a Mirena intrauterine device which she has  declined in addition to an endometrial ablation which she also had  refused.  She demonstrated on laboratory evaluation, a normal TSH and  prolactin level.  Her most recent hemoglobin on October 14, 2007, was  15.7 and her pregnancy test was negative at that time.  The patient  takes no medications to enhance her bleeding propensity and has no  personal family history of bleeding diathesis.  The patient also had  declined oral contraceptive management as well.  The patient  has  hypertension and is not a candidate for oral contraception.   OB/GYN HISTORY:  She has never been pregnant and does not use  contraception.   ALLERGIES:  None.   MEDICATIONS:  Prozac 20 mg daily; Xanax 0.5 mg up to three times a day;  lisinopril; hydrochlorothiazide actual dose unknown, one daily; atenolol  100 mg daily, atenolol one daily, Protonix 40 mg daily, one baby aspirin  81 mg daily, multivitamin, and vitamin D daily.   PAST MEDICAL HISTORY:  Depression, panic disorder, and hypertension.   PAST SURGICAL HISTORY:  Noncontributory.   SOCIAL HISTORY:  The patient smokes one and half packs of cigarettes a  day for over 30 years.  Denies illicit drug abuse or alcohol use.   FAMILY HISTORY:  She has no first-degree relatives with breast, colon,  ovarian, or uterine carcinoma.  Additionally, no first-degree relatives  with coronary artery heart disease or hypertension.   REVIEW OF SYSTEMS:  A 14-point comprehensive review of systems within  normal limits.   PHYSICAL EXAMINATION:  VITAL  SIGNS:  Blood pressure is 102/68, weight  230 pounds, and height 68 inches.  HEENT:  Grossly normal.  BREASTS:  Without masses, discharge, thickenings or tenderness.  CHEST:  Clear to percussion and auscultation.  CARDIOVASCULAR:  Without murmurs or enlargements.  Regular rate and  rhythm.  EXTREMITIES:  Within normal limits.  LYMPHATIC:  Within normal limits.  SKIN:  Within normal limits.  NEUROLOGICAL:  Within normal limits.  MUSCULOSKELETAL:  Within normal limits.  ABDOMEN:  Soft without gross hepatosplenomegaly.  PELVIC:  External genitalia, vulva, and vagina normal.  Cervix smooth  without erosions or lesions.  Uterus is 6 weeks in size and globular.  No tenderness and both adnexa palpable without masses or tenderness.   IMPRESSION:  Menometrorrhagia.   PLAN:  The patient has declined an endometrial ablation or Mirena  intrauterine device.  Due to hypertension, she is not a  candidate for  oral contraception.  Her bleeding has not improved on oral progesterone  management.  She has no desire for further childbearing and has no  medical or psychological contraindications which would outweigh the  benefits of surgical management.  She is class I [place] Cardiac  Classification.  A total vaginal hysterectomy with preservation of both  tubes and ovaries were discussed and accepted by the said patient.  Risks including possible injuries to ureter, bowel and bladder, possible  conversion to a laparoscopic or open procedure, hemorrhage requiring  blood transfusion, postoperative complications including infection,  bleeding, and pulmonary complications were discussed and understood by  the said patient.  The patient verbalized understanding of nature of  procedure, risks, benefits, and will undergo said surgery.      Ginger Carne, MD  Electronically Signed     SHB/MEDQ  D:  04/05/2008  T:  04/06/2008  Job:  [id number]

## 2011-04-21 NOTE — Group Therapy Note (Signed)
NAMECATHERINA, [name]                ACCOUNT NO.:  [id number]   MEDICAL RECORD NO.:  [id number]          PATIENT TYPE:  WOC   LOCATION:  [facility]                   [facility]ACILITY:  [facility]   PHYSICIAN:  Ginger Carne, MD DATE OF BIRTH:  1966/11/30   DATE OF SERVICE:  05/11/2008                                  CLINIC NOTE   [name] returns today for followup postoperative appointment following  a total vaginal hysterectomy, with preservation of both tubes and  ovaries, because of menometrorrhagia on Apr 09, 2008.  Essentially, she  has no specific gynecological postop complaints.  She does have rather  indistinct and diffuse complaints of upper abdominal discomfort  tightening and lack of appetite.  She is followed by the [facility]  and is on multiple medications including Prozac, Xanax, lisinopril,  atenolol, Protonix, and baby aspirin.  I think some of this may be  overriding issues in terms of depression and possibly bipolar disease.   SALIENT PHYSICAL FINDINGS:  Blood pressure is 102/73, weight 198 pounds,  height 68 inches, and pulse 82 and regular.  ABDOMEN:  Soft without gross hepatosplenomegaly.  External genitalia, vulva and vagina normal.  Cervix is absent.  Vaginal  cuff is smooth.  Both adnexa are palpable and found to be normal.  There  is no elicited tenderness on pelvic exam.   IMPRESSION:  Normal postoperative gynecological evaluation.   PLAN:  I asked the patient to follow up with the [facility] people in terms  of her abdominal generalized discomfort, and queasiness, and also lack  of appetite.  I am uncertain as to the nature of this, but I reassured  the patient this is not secondary to her surgery and has had these  issues in the past prior to her surgery.  She will follow up on a p.r.n.  basis.           ______________________________  Ginger Carne, MD     SHB/MEDQ  D:  05/11/2008  T:  05/12/2008  Job:  [id number]

## 2011-04-21 NOTE — Group Therapy Note (Signed)
NAME:  [name], [name] NO.:  [id number]   MEDICAL RECORD NO.:  [id number]          PATIENT TYPE:  WOC   LOCATION:  [facility]                   [facility]ILITY:  [facility]   PHYSICIAN:  Johnella Moloney, MD        DATE OF BIRTH:  [birth year]   DATE OF SERVICE:  10/13/2007                                  CLINIC NOTE   CHIEF COMPLAINT:  Menometrorrhagia.   HISTORY OF PRESENT ILLNESS:  The patient is a 45 year old G0, who was  self-referred for evaluation of menometrorrhagia. The patient had been  followed by another gynecologist but was referred to followup here,  given that she lost her health insurance. The patient reports having  menometrorrhagia for the last 2 years. She reports having monarchy at  age 38 with regular cycles, lasting about 5 days with moderate bleeding.  This continued until 2 years ago, when she started having her periods  irregularly, sometimes coming 2 to 3 times a month. In September of this  year, she reported bleeding throughout the whole month and in October,  she had her period two times and her last menstrual period was October 09, 2007. The patient does not report any pain with this period or any  other symptoms. Of note, she did have an endometrial biopsy in October  2007, which showed proliferative endometrium and no evidence of  hyperplasia. She had been treated with progesterone only pills in the  past and the patient is currently taking this without any resolution of  her symptoms.   REVIEW OF SYSTEMS:  On a 14 point review of systems, the patient reports  frequent weight loss, which she attributes to the recent loss of her  mother. She also reports headaches.   PAST MEDICAL HISTORY:  Arthritis, high blood pressure, lower back  problems.   PAST SURGICAL HISTORY:  None.   PAST OBSTETRIC HISTORY:  G0.   PAST GYNECOLOGIC HISTORY:  As above. The patient is not currently  sexually active and not on birth control pills. She had her last pap  smear September 2008, which was normal. She does report having abnormal  pap smear 12 years ago, for which she had colposcopy. No surgical  procedures.   SOCIAL HISTORY:  The patient smokes 1 1/2 packs per day. She has been  smoking for over 30 years. She denies any alcohol or illicit drugs.   FAMILY HISTORY:  The patient has an aunt with an unknown type of cancer.  She has another aunt with high blood pressure and 3 uncles who suffered  from heart attacks.   PHYSICAL EXAMINATION:  VITAL SIGNS:  Temperature 99.2, pulse 78, blood  pressure 125/88, weight 242 pounds. Height 68 inches.   The patient was counseled the need for an endometrial biopsy but the  patient declined this and other physical examination today.   ASSESSMENT/PLAN:  The patient is a 45 year old G0 with menometrorrhagia.  The patient was told the importance of getting an endometrial biopsy to  rule out  hyperplasia or neoplasia. She does understand the importance  of obtaining an endometrial biopsy but wants more  time to think about  it. She was also given the option of doing dilation and curettage in the  operating room, which the patient does not want to do at this time. As  part of preliminary workup, will order a pelvic ultrasound to evaluate  her uterus and endometrial line and would also check CBC, TSH,  prolactin, and HCG to rule out  any other reasons for her  menometrorrhagia. The patient will return in 2 weeks for a discussion of  these results and at this point, will decide whether to go ahead with an  endometrial biopsy or be scheduled for a dilation and curettage in the  operating room.           ______________________________  Johnella Moloney, MD     UD/MEDQ  D:  10/13/2007  T:  10/13/2007  Job:  [id number]

## 2011-05-22 ENCOUNTER — Inpatient Hospital Stay (INDEPENDENT_AMBULATORY_CARE_PROVIDER_SITE_OTHER)
Admission: RE | Admit: 2011-05-22 | Discharge: 2011-05-22 | Disposition: A | Discharge status: Home / Self Care | Payer: Self-pay | Source: Ambulatory Visit | Attending: Family Medicine | Admitting: Family Medicine

## 2011-05-22 DIAGNOSIS — K5289 Other specified noninfective gastroenteritis and colitis: Secondary | ICD-10-CM

## 2011-09-04 LAB — COMPREHENSIVE METABOLIC PANEL
ALT: 16
AST: 16
Albumin: 3.8
CO2: 26
Calcium: 9.2
Creatinine, Ser: 0.69
GFR calc Af Amer: >60
Sodium: 136
Total Protein: 6.9

## 2011-09-04 LAB — CBC
Platelets: 188
WBC: 8.3

## 2011-09-04 LAB — URINALYSIS, ROUTINE W REFLEX MICROSCOPIC
Nitrite: NEGATIVE
Protein, ur: NEGATIVE
Urobilinogen, UA: 1

## 2011-09-04 LAB — DIFFERENTIAL
Eosinophils Absolute: 0
Eosinophils Relative: 1
Lymphocytes Relative: 40
Lymphs Abs: 3.3
Monocytes Absolute: 0.4
Monocytes Relative: 5

## 2011-12-18 ENCOUNTER — Ambulatory Visit (HOSPITAL_COMMUNITY)
Admission: RE | Admit: 2011-12-18 | Discharge: 2011-12-18 | Disposition: A | Discharge status: Home / Self Care | Payer: Self-pay | Source: Ambulatory Visit | Attending: Internal Medicine | Admitting: Internal Medicine

## 2011-12-18 ENCOUNTER — Other Ambulatory Visit: Payer: Self-pay | Admitting: Internal Medicine

## 2011-12-18 DIAGNOSIS — R52 Pain, unspecified: Secondary | ICD-10-CM

## 2011-12-18 DIAGNOSIS — M542 Cervicalgia: Secondary | ICD-10-CM | POA: Insufficient documentation

## 2012-08-24 ENCOUNTER — Emergency Department (HOSPITAL_COMMUNITY): Payer: Self-pay

## 2012-08-24 ENCOUNTER — Emergency Department (HOSPITAL_COMMUNITY)
Admission: EM | Admit: 2012-08-24 | Discharge: 2012-08-24 | Disposition: A | Discharge status: Home / Self Care | Payer: Self-pay | Attending: Emergency Medicine | Admitting: Emergency Medicine

## 2012-08-24 ENCOUNTER — Encounter (HOSPITAL_COMMUNITY): Payer: Self-pay

## 2012-08-24 DIAGNOSIS — M1711 Unilateral primary osteoarthritis, right knee: Secondary | ICD-10-CM

## 2012-08-24 DIAGNOSIS — IMO0002 Reserved for concepts with insufficient information to code with codable children: Secondary | ICD-10-CM | POA: Insufficient documentation

## 2012-08-24 DIAGNOSIS — M1710 Unilateral primary osteoarthritis, unspecified knee: Secondary | ICD-10-CM | POA: Insufficient documentation

## 2012-08-24 HISTORY — DX: Major depressive disorder, single episode, unspecified: F32.9

## 2012-08-24 HISTORY — DX: Depression, unspecified: F32.A

## 2012-08-24 HISTORY — DX: Panic disorder (episodic paroxysmal anxiety): F41.0

## 2012-08-24 HISTORY — DX: Essential (primary) hypertension: I10

## 2012-08-24 MED ORDER — OXYCODONE-ACETAMINOPHEN 5-325 MG PO TABS: 2.0000 | ORAL_TABLET | ORAL | Status: DC | PRN

## 2012-08-24 NOTE — ED Notes (Signed)
Right leg pain 10/10 for a while but worsen recently, has limited her mobility

## 2012-08-24 NOTE — ED Notes (Signed)
Patient transported to X-ray 

## 2012-08-24 NOTE — Progress Notes (Signed)
Pt confirms no pcp self pay who was previously seen at [facility] She has not obtained a new pcp, has not obtained her medical records nor checked to see if her medications sent to [facility] referred pt to [phone] [facility] contact line Cm provided pt with written information for self pay pcp, medication and financial resources including pcp recommended by [facility] and [facility] contact information

## 2012-09-01 NOTE — ED Provider Notes (Signed)
Medical screening examination/treatment/procedure(s) were performed by non-physician practitioner and as supervising physician I was immediately available for consultation/collaboration.  Toy Baker, MD 09/01/12 [phone]

## 2012-09-20 ENCOUNTER — Emergency Department (HOSPITAL_COMMUNITY)
Admission: EM | Admit: 2012-09-20 | Discharge: 2012-09-20 | Disposition: A | Discharge status: Home / Self Care | Payer: Self-pay | Attending: Emergency Medicine | Admitting: Emergency Medicine

## 2012-09-20 ENCOUNTER — Encounter (HOSPITAL_COMMUNITY): Payer: Self-pay | Admitting: Emergency Medicine

## 2012-09-20 DIAGNOSIS — I10 Essential (primary) hypertension: Secondary | ICD-10-CM | POA: Insufficient documentation

## 2012-09-20 DIAGNOSIS — Z79899 Other long term (current) drug therapy: Secondary | ICD-10-CM | POA: Insufficient documentation

## 2012-09-20 DIAGNOSIS — K047 Periapical abscess without sinus: Secondary | ICD-10-CM | POA: Insufficient documentation

## 2012-09-20 MED ORDER — OXYCODONE-ACETAMINOPHEN 5-325 MG PO TABS: 2.0000 | ORAL_TABLET | Freq: Four times a day (QID) | ORAL | Status: DC | PRN

## 2012-09-20 MED ORDER — PENICILLIN V POTASSIUM 500 MG PO TABS: 1000.0000 mg | ORAL_TABLET | Freq: Two times a day (BID) | ORAL | Status: DC

## 2012-09-20 NOTE — ED Provider Notes (Signed)
History   This chart was scribed for Alice Horn, MD by Albertha Ghee Rifaie. This patient was seen in room TR07C/TR07C and the patient's care was started at 2:45PM.   CSN: [id number]  Arrival date & time 09/20/12  1232   None     Chief Complaint  Patient presents with  . Dental Pain     The history is provided by the patient. No language interpreter was used.    [name] is a 46 y.o. female who presents to the Emergency Department complaining of 2 weeks gradual onset, gradually worsening, sever left sided dental pain that is gets aggravated with eating. Pt denies confusion, chills, vomiting, and emesis. She repots having light fever but hasn't measured fever degree at home. Pt does not have a h/o chronic medical conditions. She is a current everyday smoker but denies alcohol use.   Past Medical History  Diagnosis Date  . Hypertension   . Panic disorder   . Depression     Past Surgical History  Procedure Date  . Abdominal hysterectomy     History reviewed. No pertinent family history.  History  Substance Use Topics  . Smoking status: Current Every Day Smoker  . Smokeless tobacco: Not on file  . Alcohol Use: No   No OB history provided  Review of Systems  10 Systems reviewed and all are negative for acute change except as noted in the HPI.  Allergies  Review of patient's allergies indicates no known allergies.  Home Medications   Current Outpatient Rx  Name Route Sig Dispense Refill  . ALPRAZOLAM 0.5 MG PO TABS Oral Take 0.5 mg by mouth 3 (three) times daily as needed. Anxiety    . ATENOLOL 50 MG PO TABS Oral Take 50 mg by mouth daily.    Marland Kitchen CITALOPRAM HYDROBROMIDE 20 MG PO TABS Oral Take 20 mg by mouth daily.    Marland Kitchen LISINOPRIL-HYDROCHLOROTHIAZIDE 20-25 MG PO TABS Oral Take 1 tablet by mouth daily.    . MELOXICAM 7.5 MG PO TABS Oral Take 7.5 mg by mouth daily.    . OXYCODONE-ACETAMINOPHEN 5-325 MG PO TABS Oral Take 2 tablets by mouth every 6 (six) hours  as needed for pain. 20 tablet 0  . PENICILLIN V POTASSIUM 500 MG PO TABS Oral Take 2 tablets (1,000 mg total) by mouth 2 (two) times daily. X 7 days 28 tablet 0    BP 120/80  Pulse 90  Temp 97.9 F (36.6 C) (Oral)  Resp 18  SpO2 98%  Physical Exam  Nursing note and vitals reviewed. Constitutional:       Awake, alert, nontoxic appearance.  HENT:  Head: Atraumatic.       No Trismus  Oropharynx non-injected  Tooth 15 tender to percussion with localized gingiva tenderness and swelling and no pus drainage.     Eyes: Right eye exhibits no discharge. Left eye exhibits no discharge.  Neck: Neck supple.  Pulmonary/Chest: Effort normal. She exhibits no tenderness.  Abdominal: Soft. There is no tenderness. There is no rebound.  Musculoskeletal: She exhibits no tenderness.       Baseline ROM, no obvious new focal weakness.  Neurological:       Mental status and motor strength appears baseline for patient and situation.  Skin: No rash noted.  Psychiatric: She has a normal mood and affect.    ED Course  Procedures (including critical care time)  Labs Reviewed - No data to display No results found.  DIAGNOSTIC STUDIES:  Oxygen Saturation is 98% on room air, normal by my interpretation.    COORDINATION OF CARE: 2:49PM Patient / Family / Caregiver informed of clinical course, understand medical decision-making process, and agree with plan.    1. Dental abscess       MDM   I personally performed the services described in this documentation, which was scribed in my presence. The recorded information has been reviewed and considered.     Alice Horn, MD 09/24/12 [phone]

## 2012-09-20 NOTE — ED Notes (Signed)
Pt c/o left sided dental pain x 2 weeks worse over last couple of days

## 2012-09-28 NOTE — ED Provider Notes (Signed)
History     CSN: [id number]  Arrival date & time 08/24/12  1446   First MD Initiated Contact with Patient 08/24/12 1659      No chief complaint on file.   (Consider location/radiation/quality/duration/timing/severity/associated sxs/prior treatment) HPI Comments: Patient is a 46 year old female who presents with right leg pain "for a while" with no known injury. The pain is mostly located in her knee and radiates down her right leg. The pain gradually started and progressively worsened since the onset. The pain is dull, aching, and severe. The pain is constant and made worse with movement and weight bearing activity. Patient has tried nothing for pain relief. No alleviating factors. Patient reports associated right knee swelling. Patient denies right knee bruising, wound, numbness/tingling, weakness/coolness, color changes of extremities.    Past Medical History  Diagnosis Date  . Hypertension   . Panic disorder   . Depression     Past Surgical History  Procedure Date  . Abdominal hysterectomy     No family history on file.  History  Substance Use Topics  . Smoking status: Current Every Day Smoker  . Smokeless tobacco: Not on file  . Alcohol Use: No    OB History    Grav Para Term Preterm Abortions TAB SAB Ect Mult Living                  Review of Systems  Musculoskeletal: Positive for joint swelling and arthralgias.  All other systems reviewed and are negative.    Allergies  Review of patient's allergies indicates no known allergies.  Home Medications   Current Outpatient Rx  Name Route Sig Dispense Refill  . ALPRAZOLAM 0.5 MG PO TABS Oral Take 0.5 mg by mouth 3 (three) times daily as needed. Anxiety    . ATENOLOL 50 MG PO TABS Oral Take 50 mg by mouth daily.    Marland Kitchen CITALOPRAM HYDROBROMIDE 20 MG PO TABS Oral Take 20 mg by mouth daily.    Marland Kitchen LISINOPRIL-HYDROCHLOROTHIAZIDE 20-25 MG PO TABS Oral Take 1 tablet by mouth daily.    . MELOXICAM 7.5 MG PO TABS Oral  Take 7.5 mg by mouth daily.    . OXYCODONE-ACETAMINOPHEN 5-325 MG PO TABS Oral Take 2 tablets by mouth every 6 (six) hours as needed for pain. 20 tablet 0  . PENICILLIN V POTASSIUM 500 MG PO TABS Oral Take 2 tablets (1,000 mg total) by mouth 2 (two) times daily. X 7 days 28 tablet 0    BP 127/80  Pulse 78  Temp 98.8 F (37.1 C) (Oral)  Resp 16  SpO2 97%  Physical Exam  Nursing note and vitals reviewed. Constitutional: She is oriented to person, place, and time. She appears well-developed and well-nourished. No distress.  HENT:  Head: Normocephalic and atraumatic.  Eyes: Conjunctivae normal and EOM are normal. Pupils are equal, round, and reactive to light.  Neck: Normal range of motion. Neck supple.  Cardiovascular: Normal rate, regular rhythm and intact distal pulses.  Exam reveals no gallop and no friction rub.   No murmur heard.      Sufficient cap refill of distal extremities.   Pulmonary/Chest: Effort normal and breath sounds normal. She has no wheezes. She has no rales. She exhibits no tenderness.  Abdominal: Soft. She exhibits no distension. There is no tenderness. There is no rebound.  Musculoskeletal: Normal range of motion.       Right knee ROM limited due to pain and swelling. Mild swelling of right knee  with tenderness to palpation mostly and the medial joint line. Left knee unaffected. No other joint pain or swelling.   Neurological: She is alert and oriented to person, place, and time. Coordination normal.       Speech is goal-oriented. Moves limbs without ataxia.   Skin: Skin is warm and dry. She is not diaphoretic.  Psychiatric: She has a normal mood and affect. Her behavior is normal.    ED Course  Procedures (including critical care time)  Labs Reviewed - No data to display No results found.   1. Osteoarthritis of right knee       MDM  Based on patient's xray, she has osteoarthritis of the right knee. No injury and no neurovascular compromise of distal  extremities. Patient will be discharged with Mobic and follow up with doctor for further evaluation.        Emilia Beck, PA-C 10/10/12 1043

## 2012-10-14 NOTE — ED Provider Notes (Signed)
Medical screening examination/treatment/procedure(s) were performed by non-physician practitioner and as supervising physician I was immediately available for consultation/collaboration.  Toy Baker, MD 10/14/12 [phone]

## 2012-12-10 ENCOUNTER — Encounter (HOSPITAL_COMMUNITY): Payer: Self-pay | Admitting: Nurse Practitioner

## 2012-12-10 ENCOUNTER — Emergency Department (HOSPITAL_COMMUNITY)
Admission: EM | Admit: 2012-12-10 | Discharge: 2012-12-10 | Disposition: A | Discharge status: Home / Self Care | Payer: Self-pay | Attending: Emergency Medicine | Admitting: Emergency Medicine

## 2012-12-10 DIAGNOSIS — R21 Rash and other nonspecific skin eruption: Secondary | ICD-10-CM | POA: Insufficient documentation

## 2012-12-10 DIAGNOSIS — I10 Essential (primary) hypertension: Secondary | ICD-10-CM | POA: Insufficient documentation

## 2012-12-10 DIAGNOSIS — Y939 Activity, unspecified: Secondary | ICD-10-CM | POA: Insufficient documentation

## 2012-12-10 DIAGNOSIS — X58XXXA Exposure to other specified factors, initial encounter: Secondary | ICD-10-CM | POA: Insufficient documentation

## 2012-12-10 DIAGNOSIS — F329 Major depressive disorder, single episode, unspecified: Secondary | ICD-10-CM | POA: Insufficient documentation

## 2012-12-10 DIAGNOSIS — F410 Panic disorder [episodic paroxysmal anxiety] without agoraphobia: Secondary | ICD-10-CM | POA: Insufficient documentation

## 2012-12-10 DIAGNOSIS — Y92009 Unspecified place in unspecified non-institutional (private) residence as the place of occurrence of the external cause: Secondary | ICD-10-CM | POA: Insufficient documentation

## 2012-12-10 DIAGNOSIS — Z79899 Other long term (current) drug therapy: Secondary | ICD-10-CM | POA: Insufficient documentation

## 2012-12-10 DIAGNOSIS — L02419 Cutaneous abscess of limb, unspecified: Secondary | ICD-10-CM | POA: Insufficient documentation

## 2012-12-10 DIAGNOSIS — L0390 Cellulitis, unspecified: Secondary | ICD-10-CM

## 2012-12-10 DIAGNOSIS — F17200 Nicotine dependence, unspecified, uncomplicated: Secondary | ICD-10-CM | POA: Insufficient documentation

## 2012-12-10 DIAGNOSIS — L03119 Cellulitis of unspecified part of limb: Secondary | ICD-10-CM | POA: Insufficient documentation

## 2012-12-10 DIAGNOSIS — Z8739 Personal history of other diseases of the musculoskeletal system and connective tissue: Secondary | ICD-10-CM | POA: Insufficient documentation

## 2012-12-10 DIAGNOSIS — F3289 Other specified depressive episodes: Secondary | ICD-10-CM | POA: Insufficient documentation

## 2012-12-10 HISTORY — DX: Unspecified osteoarthritis, unspecified site: M19.90

## 2012-12-10 MED ORDER — CEPHALEXIN 500 MG PO CAPS: 500.0000 mg | ORAL_CAPSULE | Freq: Four times a day (QID) | ORAL | Status: DC

## 2012-12-10 NOTE — ED Provider Notes (Signed)
History  This chart was scribed for Alice Razor, MD by Sofie Rower, ED Scribe. The patient was seen in room TR11C/TR11C and the patient's care was started at 4:03PM.       CSN: [id number]  Arrival date & time 12/10/12  1501   First MD Initiated Contact with Patient 12/10/12 1603      Chief Complaint  Patient presents with  . Blister    (Consider location/radiation/quality/duration/timing/severity/associated sxs/prior treatment) Patient is a 47 y.o. female presenting with rash and injury. The history is provided by the patient. No language interpreter was used.  Rash  This is a new problem. The current episode started more than 1 week ago. The problem has been gradually worsening. The problem is associated with nothing. There has been no fever. The rash is present on the right lower leg. The pain is moderate. The pain has been constant since onset. Associated symptoms include blisters. Pertinent negatives include no itching. She has tried nothing for the symptoms. The treatment provided no relief.  Injury  The incident occurred more than 2 days ago. The incident occurred at home. The injury mechanism is unknown. The context of the injury is unknown. There is an injury to the right lower leg (Blister to right lower leg.). The pain is moderate.    DONTAVIA BRAND is a 47 y.o. female , with a hx of Panic disorder, who presents to the Emergency Department complaining of sudden, progressively worsening, blister, located at the right anterior shin onset five days ago (12/05/12).  Associated symptoms include erythema located at the right anterior shin region. The pt reports she had a blister appear on her anterior right lower extremity, below the knee, last week. The blister burst open on Monday, 12/05/12, and has proceeded to become red and swollen at present. The pt informs she is concerned that she may have been bit by a spider.   The pt denies fever and chills.   The pt is a current everyday  smoker, however, she does not drink alcohol.    Past Medical History  Diagnosis Date  . Hypertension   . Panic disorder   . Depression   . Arthritis     Past Surgical History  Procedure Date  . Abdominal hysterectomy     History reviewed. No pertinent family history.  History  Substance Use Topics  . Smoking status: Current Every Day Smoker  . Smokeless tobacco: Not on file  . Alcohol Use: No    OB History    Grav Para Term Preterm Abortions TAB SAB Ect Mult Living                  Review of Systems  Skin: Positive for wound. Negative for itching.  All other systems reviewed and are negative.    Allergies  Review of patient's allergies indicates no known allergies.  Home Medications   Current Outpatient Rx  Name  Route  Sig  Dispense  Refill  . ALPRAZOLAM 0.5 MG PO TABS   Oral   Take 0.5 mg by mouth 3 (three) times daily as needed. Anxiety         . ATENOLOL 50 MG PO TABS   Oral   Take 50 mg by mouth daily.         Marland Kitchen CITALOPRAM HYDROBROMIDE 20 MG PO TABS   Oral   Take 20 mg by mouth daily.         Marland Kitchen LISINOPRIL-HYDROCHLOROTHIAZIDE 20-25 MG PO TABS  Oral   Take 1 tablet by mouth daily.         . MELOXICAM 7.5 MG PO TABS   Oral   Take 7.5 mg by mouth daily.         . OXYCODONE-ACETAMINOPHEN 5-325 MG PO TABS   Oral   Take 2 tablets by mouth every 6 (six) hours as needed for pain.   20 tablet   0   . PENICILLIN V POTASSIUM 500 MG PO TABS   Oral   Take 2 tablets (1,000 mg total) by mouth 2 (two) times daily. X 7 days   28 tablet   0     BP 138/72  Pulse 76  Temp 98 F (36.7 C) (Oral)  Resp 16  SpO2 97%  Physical Exam  Nursing note and vitals reviewed. Constitutional: She appears well-developed and well-nourished. No distress.  HENT:  Head: Normocephalic and atraumatic.  Eyes: Conjunctivae normal are normal. Right eye exhibits no discharge. Left eye exhibits no discharge.  Neck: Neck supple.  Cardiovascular: Normal rate,  regular rhythm and normal heart sounds.  Exam reveals no gallop and no friction rub.   No murmur heard. Pulmonary/Chest: Effort normal and breath sounds normal. No respiratory distress.  Abdominal: Soft. She exhibits no distension. There is no tenderness.  Musculoskeletal: She exhibits no edema and no tenderness.       Right anterior shin: dime sized circular region. Erythematous porter with some clearing towards the center. Flat. No drainage. Some mild increased warmth and erythema extending appoximately 2 cm.   Neurological: She is alert.  Skin: Skin is warm and dry.  Psychiatric: She has a normal mood and affect. Her behavior is normal. Thought content normal.    ED Course  Procedures (including critical care time)  DIAGNOSTIC STUDIES: Oxygen Saturation is 97% on room air, normal by my interpretation.    COORDINATION OF CARE:  4:18 PM- Treatment plan discussed with patient. Pt agrees with treatment.      Labs Reviewed - No data to display No results found.   1. Cellulitis       MDM  47 year old female with a small lesion to her right shin. This appears to be secondarily infected with a small amount of surrounding cellulitis. There is no drainage. No fluctuance. Patient is afebrile and clinically well appearing. No symptoms of systemic illness. Not immunosuppressed. Plan course of antibiotics. Emergent     I personally preformed the services scribed in my presence. The recorded information has been reviewed is accurate. Alice Razor, MD.   Alice Razor, MD 12/13/12 [phone]

## 2012-12-10 NOTE — ED Notes (Signed)
Pt reports she noticed a blister to R anterior leg below knee last week,. Then teh blister popped and now with a reddened area of infection at site

## 2013-02-16 ENCOUNTER — Emergency Department (HOSPITAL_BASED_OUTPATIENT_CLINIC_OR_DEPARTMENT_OTHER): Payer: Self-pay

## 2013-02-16 ENCOUNTER — Encounter (HOSPITAL_BASED_OUTPATIENT_CLINIC_OR_DEPARTMENT_OTHER): Payer: Self-pay | Admitting: *Deleted

## 2013-02-16 ENCOUNTER — Emergency Department (HOSPITAL_BASED_OUTPATIENT_CLINIC_OR_DEPARTMENT_OTHER)
Admission: EM | Admit: 2013-02-16 | Discharge: 2013-02-16 | Disposition: A | Discharge status: Home / Self Care | Payer: Self-pay | Attending: Emergency Medicine | Admitting: Emergency Medicine

## 2013-02-16 DIAGNOSIS — F3289 Other specified depressive episodes: Secondary | ICD-10-CM | POA: Insufficient documentation

## 2013-02-16 DIAGNOSIS — Z79899 Other long term (current) drug therapy: Secondary | ICD-10-CM | POA: Insufficient documentation

## 2013-02-16 DIAGNOSIS — F329 Major depressive disorder, single episode, unspecified: Secondary | ICD-10-CM | POA: Insufficient documentation

## 2013-02-16 DIAGNOSIS — F410 Panic disorder [episodic paroxysmal anxiety] without agoraphobia: Secondary | ICD-10-CM | POA: Insufficient documentation

## 2013-02-16 DIAGNOSIS — F17200 Nicotine dependence, unspecified, uncomplicated: Secondary | ICD-10-CM | POA: Insufficient documentation

## 2013-02-16 DIAGNOSIS — N390 Urinary tract infection, site not specified: Secondary | ICD-10-CM | POA: Insufficient documentation

## 2013-02-16 DIAGNOSIS — Z791 Long term (current) use of non-steroidal anti-inflammatories (NSAID): Secondary | ICD-10-CM | POA: Insufficient documentation

## 2013-02-16 DIAGNOSIS — I10 Essential (primary) hypertension: Secondary | ICD-10-CM | POA: Insufficient documentation

## 2013-02-16 DIAGNOSIS — M129 Arthropathy, unspecified: Secondary | ICD-10-CM | POA: Insufficient documentation

## 2013-02-16 DIAGNOSIS — K759 Inflammatory liver disease, unspecified: Secondary | ICD-10-CM | POA: Insufficient documentation

## 2013-02-16 DIAGNOSIS — R17 Unspecified jaundice: Secondary | ICD-10-CM | POA: Insufficient documentation

## 2013-02-16 LAB — COMPREHENSIVE METABOLIC PANEL
ALT: 341 U/L — ABNORMAL HIGH (ref 0–35)
AST: 168 U/L — ABNORMAL HIGH (ref 0–37)
Alkaline Phosphatase: 200 U/L — ABNORMAL HIGH (ref 39–117)
CO2: 23 mEq/L (ref 19–32)
Chloride: 103 mEq/L (ref 96–112)
GFR calc Af Amer: >90 mL/min (ref >90)
GFR calc non Af Amer: >90 mL/min (ref >90)
Glucose, Bld: 104 mg/dL — ABNORMAL HIGH (ref 70–99)
Potassium: 3.3 mEq/L — ABNORMAL LOW (ref 3.5–5.1)
Sodium: 140 mEq/L (ref 135–145)
Total Bilirubin: 6.8 mg/dL — ABNORMAL HIGH (ref 0.3–1.2)

## 2013-02-16 LAB — CBC WITH DIFFERENTIAL/PLATELET
Basophils Absolute: 0 10*3/uL (ref 0.0–0.1)
HCT: 44.3 % (ref 36.0–46.0)
Lymphocytes Relative: 6 % — ABNORMAL LOW (ref 12–46)
Lymphs Abs: 0.4 10*3/uL — ABNORMAL LOW (ref 0.7–4.0)
MCV: 98.4 fL (ref 78.0–100.0)
Neutro Abs: 5.5 10*3/uL (ref 1.7–7.7)
Platelets: 141 10*3/uL — ABNORMAL LOW (ref 150–400)
RBC: 4.5 MIL/uL (ref 3.87–5.11)
RDW: 13 % (ref 11.5–15.5)
WBC: 6.4 10*3/uL (ref 4.0–10.5)

## 2013-02-16 LAB — URINE MICROSCOPIC-ADD ON

## 2013-02-16 LAB — URINALYSIS, ROUTINE W REFLEX MICROSCOPIC
Glucose, UA: NEGATIVE mg/dL
Hgb urine dipstick: NEGATIVE
Protein, ur: NEGATIVE mg/dL
Specific Gravity, Urine: 1.029 (ref 1.005–1.030)

## 2013-02-16 LAB — BILIRUBIN, DIRECT: Bilirubin, Direct: 5.2 mg/dL — ABNORMAL HIGH (ref 0.0–0.3)

## 2013-02-16 MED ORDER — CIPROFLOXACIN HCL 500 MG PO TABS: 500.0000 mg | ORAL_TABLET | Freq: Two times a day (BID) | ORAL | Status: DC

## 2013-02-16 NOTE — ED Notes (Signed)
Abdominal pain x 2 days. Nausea.  

## 2013-02-16 NOTE — ED Notes (Signed)
Pt. also c/o generalized body aches and fever since 02/14/13.

## 2013-02-16 NOTE — ED Notes (Signed)
Pt. states she forget to report a needle stick at her place of employment approx 8 weeks ago.  Unknown if needle was used or clean.  She reports all lab work collected to this point has been normal.

## 2013-02-16 NOTE — ED Provider Notes (Signed)
History     CSN: [id number]  Arrival date & time 02/16/13  1236   First MD Initiated Contact with Patient 02/16/13 1333      Chief Complaint  Patient presents with  . Abdominal Pain    (Consider location/radiation/quality/duration/timing/severity/associated sxs/prior treatment) HPI Comments: Patient presents with a two day history of generalized abd pain/cramping.  She has also noticed dark urine but no dysuria.  No fevers or chills.  Patient is a 47 y.o. female presenting with abdominal pain. The history is provided by the patient.  Abdominal Pain Pain location:  Generalized Pain quality: cramping   Pain radiates to:  Does not radiate Pain severity:  Moderate Onset quality:  Gradual Duration:  2 days Timing:  Intermittent Progression:  Worsening Chronicity:  New Context: not diet changes   Relieved by:  Nothing Worsened by:  Nothing tried   Past Medical History  Diagnosis Date  . Hypertension   . Panic disorder   . Depression   . Arthritis     Past Surgical History  Procedure Laterality Date  . Abdominal hysterectomy      No family history on file.  History  Substance Use Topics  . Smoking status: Current Every Day Smoker  . Smokeless tobacco: Not on file  . Alcohol Use: No    OB History   Grav Para Term Preterm Abortions TAB SAB Ect Mult Living                  Review of Systems  Gastrointestinal: Positive for abdominal pain.  All other systems reviewed and are negative.    Allergies  Review of patient's allergies indicates no known allergies.  Home Medications   Current Outpatient Rx  Name  Route  Sig  Dispense  Refill  . ALPRAZolam (XANAX) 0.5 MG tablet   Oral   Take 0.5 mg by mouth 3 (three) times daily. For anxiety         . Aspirin-Acetaminophen-Caffeine (EXCEDRIN EXTRA STRENGTH PO)   Oral   Take 2 tablets by mouth 2 (two) times daily as needed. For pain         . atenolol (TENORMIN) 50 MG tablet   Oral   Take 50 mg by  mouth daily.         . cephALEXin (KEFLEX) 500 MG capsule   Oral   Take 1 capsule (500 mg total) by mouth 4 (four) times daily.   30 capsule   0   . citalopram (CELEXA) 20 MG tablet   Oral   Take 20 mg by mouth daily.         Marland Kitchen lisinopril-hydrochlorothiazide (PRINZIDE,ZESTORETIC) 20-25 MG per tablet   Oral   Take 0.5 tablets by mouth daily.          . meloxicam (MOBIC) 7.5 MG tablet   Oral   Take 7.5 mg by mouth daily.           BP 140/82  Pulse 88  Temp(Src) 98.2 F (36.8 C) (Oral)  Resp 18  Wt 242 lb (109.77 kg)  BMI 37.32 kg/m2  SpO2 100%  Physical Exam  Nursing note and vitals reviewed. Constitutional: She appears well-developed and well-nourished. No distress.  HENT:  Head: Normocephalic and atraumatic.  Mouth/Throat: Oropharynx is clear and moist.  Neck: Normal range of motion. Neck supple.  Cardiovascular: Normal rate and regular rhythm.   No murmur heard. Pulmonary/Chest: Effort normal and breath sounds normal.  Abdominal: Soft. Bowel sounds are normal.  There  is ttp in the epigastric region with no rebound or guarding.  Bowel sounds are present and abdomen is obese, but non-distended.  Skin: She is not diaphoretic.    ED Course  Procedures (including critical care time)  Labs Reviewed  URINALYSIS, ROUTINE W REFLEX MICROSCOPIC - Abnormal; Notable for the following:    Color, Urine ORANGE (*)    APPearance CLOUDY (*)    Bilirubin Urine LARGE (*)    Ketones, ur >80 (*)    Nitrite POSITIVE (*)    Leukocytes, UA SMALL (*)    All other components within normal limits  URINE MICROSCOPIC-ADD ON - Abnormal; Notable for the following:    Squamous Epithelial / LPF FEW (*)    Bacteria, UA FEW (*)    All other components within normal limits  URINE CULTURE  CBC WITH DIFFERENTIAL  COMPREHENSIVE METABOLIC PANEL  LIPASE, BLOOD   No results found.   No diagnosis found.    MDM  The labs reveal hepatitis, the etiology of which I am unsure.  The  ultrasound shows stones in the gallbladder, however there is no evidence for obstruction.  She reports a needle stick at work about six weeks ago and I have added a hepatitis panel.  I discussed the case with Dr. Elnoria Howard from gastroenterology who can follow her up on Monday as she prefers not to be admitted.  She will be treated for what appears to be a uti, and will return prn if she worsens.  She will call Dr. Haywood Pao office to arrange follow up.          Geoffery Lyons, MD 02/16/13 [phone]

## 2013-02-17 LAB — URINE CULTURE: Colony Count: NO GROWTH

## 2013-02-17 LAB — HEPATITIS PANEL, ACUTE: HCV Ab: NEGATIVE

## 2013-02-20 ENCOUNTER — Telehealth (HOSPITAL_COMMUNITY): Payer: Self-pay | Admitting: Emergency Medicine

## 2013-02-21 ENCOUNTER — Emergency Department (HOSPITAL_COMMUNITY): Payer: Self-pay

## 2013-02-21 ENCOUNTER — Emergency Department (HOSPITAL_COMMUNITY)
Admission: EM | Admit: 2013-02-21 | Discharge: 2013-02-21 | Disposition: A | Discharge status: Home / Self Care | Payer: Self-pay | Attending: Emergency Medicine | Admitting: Emergency Medicine

## 2013-02-21 ENCOUNTER — Encounter (HOSPITAL_COMMUNITY): Payer: Self-pay | Admitting: *Deleted

## 2013-02-21 DIAGNOSIS — F410 Panic disorder [episodic paroxysmal anxiety] without agoraphobia: Secondary | ICD-10-CM | POA: Insufficient documentation

## 2013-02-21 DIAGNOSIS — I10 Essential (primary) hypertension: Secondary | ICD-10-CM | POA: Insufficient documentation

## 2013-02-21 DIAGNOSIS — K8020 Calculus of gallbladder without cholecystitis without obstruction: Secondary | ICD-10-CM | POA: Insufficient documentation

## 2013-02-21 DIAGNOSIS — K219 Gastro-esophageal reflux disease without esophagitis: Secondary | ICD-10-CM | POA: Insufficient documentation

## 2013-02-21 DIAGNOSIS — R110 Nausea: Secondary | ICD-10-CM | POA: Insufficient documentation

## 2013-02-21 DIAGNOSIS — F3289 Other specified depressive episodes: Secondary | ICD-10-CM | POA: Insufficient documentation

## 2013-02-21 DIAGNOSIS — K8050 Calculus of bile duct without cholangitis or cholecystitis without obstruction: Secondary | ICD-10-CM

## 2013-02-21 DIAGNOSIS — F17200 Nicotine dependence, unspecified, uncomplicated: Secondary | ICD-10-CM | POA: Insufficient documentation

## 2013-02-21 LAB — COMPREHENSIVE METABOLIC PANEL
ALT: 109 U/L — ABNORMAL HIGH (ref 0–35)
AST: 37 U/L (ref 0–37)
CO2: 30 mEq/L (ref 19–32)
Chloride: 103 mEq/L (ref 96–112)
Creatinine, Ser: 0.64 mg/dL (ref 0.50–1.10)
GFR calc non Af Amer: >90 mL/min (ref >90)
Glucose, Bld: 97 mg/dL (ref 70–99)
Total Bilirubin: 0.7 mg/dL (ref 0.3–1.2)

## 2013-02-21 LAB — CBC WITH DIFFERENTIAL/PLATELET
Basophils Absolute: 0 10*3/uL (ref 0.0–0.1)
HCT: 45.6 % (ref 36.0–46.0)
Hemoglobin: 15.2 g/dL — ABNORMAL HIGH (ref 12.0–15.0)
Lymphocytes Relative: 31 % (ref 12–46)
Lymphs Abs: 2.4 10*3/uL (ref 0.7–4.0)
Monocytes Absolute: 0.5 10*3/uL (ref 0.1–1.0)
Neutro Abs: 4.7 10*3/uL (ref 1.7–7.7)
RBC: 4.48 MIL/uL (ref 3.87–5.11)
RDW: 13.4 % (ref 11.5–15.5)
WBC: 7.6 10*3/uL (ref 4.0–10.5)

## 2013-02-21 LAB — URINALYSIS, ROUTINE W REFLEX MICROSCOPIC
Ketones, ur: NEGATIVE mg/dL
Leukocytes, UA: NEGATIVE
Nitrite: NEGATIVE
Specific Gravity, Urine: 1.03 (ref 1.005–1.030)
pH: 6 (ref 5.0–8.0)

## 2013-02-21 MED ORDER — MORPHINE SULFATE 4 MG/ML IJ SOLN: 4.0000 mg | Freq: Once | INTRAMUSCULAR | Status: DC

## 2013-02-21 MED ORDER — ONDANSETRON HCL 4 MG/2ML IJ SOLN: 4.0000 mg | Freq: Once | INTRAMUSCULAR | Status: DC

## 2013-02-21 NOTE — ED Provider Notes (Signed)
  Physical Exam  BP 129/78  Pulse 68  Temp(Src) 97.3 F (36.3 C) (Oral)  Resp 18  SpO2 99%  Physical Exam Reviewed Ultrasound with Dr. Ranae Palms and Dr. Daphine Deutscher wil discuss with GI as well  ED Course  Procedures At this time patient is pain free and requesting a meal encouraged to eat low fat diet  MDM Discharge pending GI discussion  Dr. Elnoria Howard agrees to see patient in office tomorrow morning      Arman Filter, NP 02/21/13 2334

## 2013-02-21 NOTE — ED Provider Notes (Signed)
History     CSN: [id number]  Arrival date & time 02/21/13  1604   First MD Initiated Contact with Patient 02/21/13 1705      Chief Complaint  Patient presents with  . Abdominal Pain    (Consider location/radiation/quality/duration/timing/severity/associated sxs/prior treatment) HPI [name] is a 47 y.o. female who presents to ED with complaint of abdominal pain. States pain started about a week ago. Intermittent. Associated with nausea. No fever, chills, vomiting, diarrhea. No chest pain. Was seen at [facility] 4 days ago, was told she may have hepatitis, had elevated LFTs. Pt was given a follow up with Dr. Elnoria Howard, who she was unable to see because could not pay the co pay. States also had hepatitis panel drawn, which all returned negative. Pt stats pain continues. States it is severe at times and eases off at times. Not taking any medications for this. Pain is mainly in the upper abdomen. Sharp. Nothing makes it better.     Past Medical History  Diagnosis Date  . Hypertension   . Panic disorder   . Depression   . Arthritis     Past Surgical History  Procedure Laterality Date  . Abdominal hysterectomy      History reviewed. No pertinent family history.  History  Substance Use Topics  . Smoking status: Current Every Day Smoker  . Smokeless tobacco: Not on file  . Alcohol Use: No    OB History   Grav Para Term Preterm Abortions TAB SAB Ect Mult Living                  Review of Systems  Constitutional: Negative for fever and chills.  Respiratory: Negative.   Cardiovascular: Negative.   Gastrointestinal: Positive for nausea and abdominal pain. Negative for vomiting, diarrhea and constipation.  Genitourinary: Negative for dysuria and flank pain.  Musculoskeletal: Negative.   Neurological: Negative for weakness and numbness.  All other systems reviewed and are negative.    Allergies  Review of patient's allergies indicates no known allergies.  Home Medications    Current Outpatient Rx  Name  Route  Sig  Dispense  Refill  . ALPRAZolam (XANAX) 0.5 MG tablet   Oral   Take 0.5 mg by mouth 3 (three) times daily. For anxiety         . atenolol (TENORMIN) 50 MG tablet   Oral   Take 50 mg by mouth daily.         . ciprofloxacin (CIPRO) 500 MG tablet   Oral   Take 500 mg by mouth 2 (two) times daily.         . citalopram (CELEXA) 20 MG tablet   Oral   Take 20 mg by mouth daily.         Marland Kitchen lisinopril-hydrochlorothiazide (PRINZIDE,ZESTORETIC) 20-25 MG per tablet   Oral   Take 0.5 tablets by mouth daily.            BP 154/88  Pulse 94  Temp(Src) 98.6 F (37 C) (Oral)  Resp 20  SpO2 95%  Physical Exam  Nursing note and vitals reviewed. Constitutional: She appears well-developed and well-nourished. No distress.  Neck: Neck supple.  Cardiovascular: Normal rate, regular rhythm and normal heart sounds.   Pulmonary/Chest: Effort normal and breath sounds normal. No respiratory distress. She has no wheezes. She has no rales.  Abdominal: Soft. Bowel sounds are normal. She exhibits no distension. There is tenderness. There is no rebound and no guarding.  RUQ tenderness  Musculoskeletal: She exhibits no edema.  Skin: Skin is warm.    ED Course  Procedures (including critical care time)  PT with RUQ tenderenss. US showed gallstones, elevated LFTs in ED few days ago. Unable to follow up, unable to pay copay. Will repeat blood tests. Discussed with Dr. Rubin Payor, who advised to repeat US to r/o cholecystitis.   Results for orders placed during the hospital encounter of 02/21/13  CBC WITH DIFFERENTIAL      Result Value Range   WBC 7.6  4.0 - 10.5 K/uL   RBC 4.48  3.87 - 5.11 MIL/uL   Hemoglobin 15.2 (*) 12.0 - 15.0 g/dL   HCT 11.9  14.7 - 82.9 %   MCV 101.8 (*) 78.0 - 100.0 fL   MCH 33.9  26.0 - 34.0 pg   MCHC 33.3  30.0 - 36.0 g/dL   RDW 56.2  13.0 - 86.5 %   Platelets 221  150 - 400 K/uL   Neutrophils Relative 61  43 - 77 %    Neutro Abs 4.7  1.7 - 7.7 K/uL   Lymphocytes Relative 31  12 - 46 %   Lymphs Abs 2.4  0.7 - 4.0 K/uL   Monocytes Relative 6  3 - 12 %   Monocytes Absolute 0.5  0.1 - 1.0 K/uL   Eosinophils Relative 1  0 - 5 %   Eosinophils Absolute 0.1  0.0 - 0.7 K/uL   Basophils Relative 0  0 - 1 %   Basophils Absolute 0.0  0.0 - 0.1 K/uL  COMPREHENSIVE METABOLIC PANEL      Result Value Range   Sodium 142  135 - 145 mEq/L   Potassium 3.3 (*) 3.5 - 5.1 mEq/L   Chloride 103  96 - 112 mEq/L   CO2 30  19 - 32 mEq/L   Glucose, Bld 97  70 - 99 mg/dL   BUN 14  6 - 23 mg/dL   Creatinine, Ser 7.84  0.50 - 1.10 mg/dL   Calcium 9.7  8.4 - 69.6 mg/dL   Total Protein 7.0  6.0 - 8.3 g/dL   Albumin 3.5  3.5 - 5.2 g/dL   AST 37  0 - 37 U/L   ALT 109 (*) 0 - 35 U/L   Alkaline Phosphatase 249 (*) 39 - 117 U/L   Total Bilirubin 0.7  0.3 - 1.2 mg/dL   GFR calc non Af Amer >90  >90 mL/min   GFR calc Af Amer >90  >90 mL/min  LIPASE, BLOOD      Result Value Range   Lipase 28  11 - 59 U/L  URINALYSIS, ROUTINE W REFLEX MICROSCOPIC      Result Value Range   Color, Urine YELLOW  YELLOW   APPearance CLEAR  CLEAR   Specific Gravity, Urine 1.030  1.005 - 1.030   pH 6.0  5.0 - 8.0   Glucose, UA NEGATIVE  NEGATIVE mg/dL   Hgb urine dipstick NEGATIVE  NEGATIVE   Bilirubin Urine NEGATIVE  NEGATIVE   Ketones, ur NEGATIVE  NEGATIVE mg/dL   Protein, ur NEGATIVE  NEGATIVE mg/dL   Urobilinogen, UA 0.2  0.0 - 1.0 mg/dL   Nitrite NEGATIVE  NEGATIVE   Leukocytes, UA NEGATIVE  NEGATIVE   US Abdomen Complete  02/16/2013  *RADIOLOGY REPORT*  Clinical Data:  2-day history of unspecified abdominal pain. Possible jaundice.  COMPLETE ABDOMINAL ULTRASOUND  Comparison:  None.  Findings:  Gallbladder:  Contracted, containing numerous shadowing gallstones. No visible gallbladder  wall thickening or pericholecystic fluid. Negative sonographic Murphy's sign according to the ultrasound technologist.  Common bile duct:  Normal in caliber with  maximum diameter approximating 4 mm.  Liver:  At least 2 calcified, shadowing granulomata, the largest approximating 8 mm in the medial segment left lobe.  No significant focal hepatic parenchymal abnormality.  Diffusely increased and coarsened echotexture. Patent portal vein with hepatopetal flow.  IVC:  Patent in its intrahepatic portion.  Obscured outside the liver by overlying bowel gas.  Pancreas:  Essentially obscured by overlying bowel gas.  Spleen:  7 mm calcified, shadowing granuloma near the hilum.  No significant focal splenic parenchymal abnormality.  Normal in size and parenchymal echotexture.  Right Kidney:  No hydronephrosis.  Well-preserved cortex.  No shadowing calculi.  Normal size and parenchymal echotexture without focal abnormalities.  Approximately 11.2 cm.  Left Kidney:  No hydronephrosis.  Well-preserved cortex.  No shadowing calculi.  Normal size and parenchymal echotexture without focal abnormalities.  Approximately 11.2 cm.  Abdominal aorta:  Normal in caliber proximally; obscured in its mid and distal portion by overlying bowel gas.  IMPRESSION:  1.  Contracted gallbladder containing numerous gallstones. Negative sonographic Murphy's sign.  No biliary ductal dilation. 2.  Diffuse hepatic steatosis without focal hepatic parenchymal abnormality. 3.  Calcified granulomata in the liver and spleen. 4.  Midline structures (extrahepatic IVC, pancreas, and mid and distal abdominal aorta) obscured by bowel gas and therefore not evaluated.   Original Report Authenticated By: Hulan Saas, M.D.    [place] pending. Pt's pain under control. Pt signed out with PA Manus Rudd.    No diagnosis found.    MDM          Lottie Mussel, PA-C 02/24/13 2351

## 2013-02-21 NOTE — ED Notes (Signed)
Pt c/o intermittent abd pain, reports was seen for same at [facility]. Reports still having pain and concerned with "gallbladder"

## 2013-02-21 NOTE — ED Notes (Signed)
Pt requesting that medications not be given at this time. Pt instructed to inform me when she is in need of medications.

## 2013-03-01 ENCOUNTER — Other Ambulatory Visit: Payer: Self-pay | Admitting: Gastroenterology

## 2013-03-03 ENCOUNTER — Encounter (HOSPITAL_COMMUNITY)
Admission: RE | Disposition: A | Discharge status: Home / Self Care | Payer: Self-pay | Source: Ambulatory Visit | Attending: Gastroenterology

## 2013-03-03 ENCOUNTER — Ambulatory Visit (HOSPITAL_COMMUNITY)
Admission: RE | Admit: 2013-03-03 | Discharge: 2013-03-03 | Disposition: A | Discharge status: Home / Self Care | Payer: Self-pay | Source: Ambulatory Visit | Attending: Gastroenterology | Admitting: Gastroenterology

## 2013-03-03 ENCOUNTER — Ambulatory Visit (HOSPITAL_COMMUNITY): Payer: Self-pay

## 2013-03-03 ENCOUNTER — Encounter (HOSPITAL_COMMUNITY): Payer: Self-pay | Admitting: *Deleted

## 2013-03-03 DIAGNOSIS — K219 Gastro-esophageal reflux disease without esophagitis: Secondary | ICD-10-CM | POA: Insufficient documentation

## 2013-03-03 DIAGNOSIS — R748 Abnormal levels of other serum enzymes: Secondary | ICD-10-CM | POA: Insufficient documentation

## 2013-03-03 DIAGNOSIS — F17200 Nicotine dependence, unspecified, uncomplicated: Secondary | ICD-10-CM | POA: Insufficient documentation

## 2013-03-03 DIAGNOSIS — I10 Essential (primary) hypertension: Secondary | ICD-10-CM | POA: Insufficient documentation

## 2013-03-03 DIAGNOSIS — K8070 Calculus of gallbladder and bile duct without cholecystitis without obstruction: Secondary | ICD-10-CM | POA: Insufficient documentation

## 2013-03-03 DIAGNOSIS — K838 Other specified diseases of biliary tract: Secondary | ICD-10-CM | POA: Insufficient documentation

## 2013-03-03 DIAGNOSIS — K5730 Diverticulosis of large intestine without perforation or abscess without bleeding: Secondary | ICD-10-CM | POA: Insufficient documentation

## 2013-03-03 DIAGNOSIS — R109 Unspecified abdominal pain: Secondary | ICD-10-CM | POA: Insufficient documentation

## 2013-03-03 HISTORY — DX: Gastro-esophageal reflux disease without esophagitis: K21.9

## 2013-03-03 HISTORY — DX: Calculus of gallbladder without cholecystitis without obstruction: K80.20

## 2013-03-03 HISTORY — PX: EUS: SHX5427

## 2013-03-03 HISTORY — PX: ERCP: SHX5425

## 2013-03-03 SURGERY — ERCP, WITH INTERVENTION IF INDICATED
Anesthesia: Moderate Sedation

## 2013-03-03 MED ORDER — PROMETHAZINE HCL 25 MG/ML IJ SOLN
INTRAMUSCULAR | Status: DC | PRN
  Administered 2013-03-03 (×2): 12.5 mg via INTRAVENOUS

## 2013-03-03 MED ORDER — GLUCAGON HCL (RDNA) 1 MG IJ SOLR
INTRAMUSCULAR | Status: AC
  Filled 2013-03-03: qty 3

## 2013-03-03 MED ORDER — PROMETHAZINE HCL 25 MG/ML IJ SOLN
INTRAMUSCULAR | Status: AC
  Filled 2013-03-03: qty 1

## 2013-03-03 MED ORDER — SODIUM CHLORIDE 0.9 % IV SOLN
INTRAVENOUS | Status: DC | PRN
  Administered 2013-03-03: 15:00:00

## 2013-03-03 MED ORDER — CIPROFLOXACIN IN D5W 400 MG/200ML IV SOLN
INTRAVENOUS | Status: AC
  Filled 2013-03-03: qty 200

## 2013-03-03 MED ORDER — SODIUM CHLORIDE 0.9 % IV SOLN
INTRAVENOUS | Status: DC
  Administered 2013-03-03: 500 mL via INTRAVENOUS

## 2013-03-03 MED ORDER — MIDAZOLAM HCL 10 MG/2ML IJ SOLN
INTRAMUSCULAR | Status: AC
  Filled 2013-03-03: qty 6

## 2013-03-03 MED ORDER — FENTANYL CITRATE 0.05 MG/ML IJ SOLN
INTRAMUSCULAR | Status: AC
  Filled 2013-03-03: qty 6

## 2013-03-03 MED ORDER — CIPROFLOXACIN IN D5W 400 MG/200ML IV SOLN
400.0000 mg | Freq: Once | INTRAVENOUS | Status: AC
  Administered 2013-03-03: 400 mg via INTRAVENOUS

## 2013-03-03 MED ORDER — FENTANYL CITRATE 0.05 MG/ML IJ SOLN
INTRAMUSCULAR | Status: DC | PRN
  Administered 2013-03-03 (×5): 25 ug via INTRAVENOUS

## 2013-03-03 MED ORDER — DIPHENHYDRAMINE HCL 50 MG/ML IJ SOLN
INTRAMUSCULAR | Status: AC
  Filled 2013-03-03: qty 1

## 2013-03-03 MED ORDER — MIDAZOLAM HCL 10 MG/2ML IJ SOLN
INTRAMUSCULAR | Status: DC | PRN
  Administered 2013-03-03 (×2): 2.5 mg via INTRAVENOUS
  Administered 2013-03-03: 2 mg via INTRAVENOUS
  Administered 2013-03-03 (×2): 2.5 mg via INTRAVENOUS

## 2013-03-03 MED ORDER — BUTAMBEN-TETRACAINE-BENZOCAINE 2-2-14 % EX AERO
INHALATION_SPRAY | CUTANEOUS | Status: DC | PRN
  Administered 2013-03-03: 2 via TOPICAL

## 2013-03-03 NOTE — H&P (Signed)
Reason for Consult: ? Choledocholithiasis and abdominal pain. Referring Physician: Cephus Shelling HPI: This is a 47 year old female with findings of cholelithiasis and possible choledocholithiasis.  She presented to the ER with complaints of abdominal pain and at that time she was noted to have a significantly elevated liver panel and cholelithiasis.  Her CBD at that time was only 4 mm.  She was sent home and then she represented to [facility] ER with similar complaints and her enzymes were noted to be declining, but her CBD was dilated at 1 cm.  Since her ER visit she denies any further issues with pain.  Past Medical History  Diagnosis Date  . Hypertension   . Panic disorder   . Depression   . Arthritis   . GERD (gastroesophageal reflux disease)   . Gallstones     Past Surgical History  Procedure Laterality Date  . Abdominal hysterectomy      History reviewed. No pertinent family history.  Social History:  reports that she has been smoking Cigarettes.  She has a 30 pack-year smoking history. She does not have any smokeless tobacco history on file. She reports that she does not drink alcohol or use illicit drugs.  Allergies: No Known Allergies  Medications:  Scheduled: . ciprofloxacin  400 mg Intravenous Once   Continuous: . sodium chloride 500 mL (03/03/13 1326)    No results found for this or any previous visit (from the past 24 hour(s)).   No results found.  ROS:  As stated above in the HPI otherwise negative.  Blood pressure 130/101, pulse 80, temperature 98.2 F (36.8 C), temperature source Oral, resp. rate 15, height 5' 7.5" (1.715 m), SpO2 96.00%.    PE: Gen: NAD, Alert and Oriented HEENT:  [place]/AT, EOMI Neck: Supple, no LAD Lungs: CTA Bilaterally CV: RRR without M/G/R ABM: Soft, NTND, +BS Ext: No C/C/E  Assessment/Plan: 1) ? Choledocholithiasis. 2) Cholelithiasis. 3) Abnormal liver enzymes.  Plan: 1) EUS/ERCP.  Jadavion Spoelstra D 03/03/2013, 1:59 PM

## 2013-03-03 NOTE — Op Note (Signed)
[facility] [address] [place], [address]   ENDOSCOPIC ULTRASOUND PROCEDURE REPORT  PATIENT: [name], [name]  MR#: [id number] BIRTHDATE: 16-Dec-1965  GENDER: Female ENDOSCOPIST: Jeani Hawking, MD REFERRED BY: PROCEDURE DATE:  03/03/2013 PROCEDURE:   Upper EUS/ERCP ASA CLASS:      Class III INDICATIONS:   1.  Abnormal CBD. MEDICATIONS: Versed 12 mg IV, Fentanyl 125 mcg IV, and Promethazine (Phenergan) 25 mg IV  DESCRIPTION OF PROCEDURE:   After the risks benefits and alternatives of the procedure were  explained, informed consent was obtained. The patient was then placed in the left, lateral, decubitus postion and IV sedation was administered. Throughout the procedure, the patients blood pressure, pulse and oxygen saturations were monitored continuously.  Under direct visualization, the     endoscope was introduced through the mouth and advanced to the second portion of the duodenum .  Water was used as necessary to provide an acoustic interface.  Upon completion of the imaging, water was removed and the patient was sent to the recovery room in satisfactory condition.      FINDINGS: With the EUS scope the CBD was well visualized as well as the cystic duct.  The CBD meassured 5-6 mm in the mid CBD.  In the distal CBD a stone was identified and at that time the procedure was converted to an ERCP.  The ampulla was visualized and spontaneous drainage of bile was noted.  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  The CBD was easily cannulated with the sphinterotome and the guidewire was secured in the right intrahepatic ducts.  A 1 cm sphincterotomy was created.  The extraction balloon was then used and after one sweep a large stone was extracted.  A final occlussion cholangiogram was negative for any retained stones.   The scope was then withdrawn from the patient and the procedure completed.  COMPLICATIONS: There were no complications. ENDOSCOPIC  VISUALIZATION:  ULTRASONIC VISUALIZATION:  STAGING:  ENDOSCOPIC IMPRESSION: 1) Successful extraction of a distal CBD stone.  RECOMMENDATIONS: 1) Surgical consultation for lap chole.   _______________________________ eSignedJeani Hawking, MD 03/03/2013 3:34 PM   CC:

## 2013-03-06 ENCOUNTER — Encounter (HOSPITAL_COMMUNITY): Payer: Self-pay | Admitting: Gastroenterology

## 2013-03-07 NOTE — ED Provider Notes (Signed)
Medical screening examination/treatment/procedure(s) were conducted as a shared visit with non-physician practitioner(s) and myself.  I personally evaluated the patient during the encounter   Venissa Nappi, MD 03/07/13 1555 

## 2013-03-07 NOTE — ED Provider Notes (Signed)
Medical screening examination/treatment/procedure(s) were conducted as a shared visit with non-physician practitioner(s) and myself.  I personally evaluated the patient during the encounter   Loren Racer, MD 03/07/13 1547

## 2013-03-13 ENCOUNTER — Encounter (HOSPITAL_COMMUNITY): Payer: Self-pay | Admitting: *Deleted

## 2013-03-13 ENCOUNTER — Ambulatory Visit (INDEPENDENT_AMBULATORY_CARE_PROVIDER_SITE_OTHER): Payer: Self-pay | Admitting: Surgery

## 2013-03-13 ENCOUNTER — Encounter (HOSPITAL_COMMUNITY): Payer: Self-pay | Admitting: Pharmacy Technician

## 2013-03-13 ENCOUNTER — Encounter (INDEPENDENT_AMBULATORY_CARE_PROVIDER_SITE_OTHER): Payer: Self-pay | Admitting: Surgery

## 2013-03-13 VITALS — BP 138/84 | HR 72 | Temp 97.3°F | Resp 16 | Ht 68.5 in | Wt 250.0 lb

## 2013-03-13 DIAGNOSIS — K8050 Calculus of bile duct without cholangitis or cholecystitis without obstruction: Secondary | ICD-10-CM

## 2013-03-13 DIAGNOSIS — K8010 Calculus of gallbladder with chronic cholecystitis without obstruction: Secondary | ICD-10-CM | POA: Insufficient documentation

## 2013-03-13 HISTORY — DX: Calculus of bile duct without cholangitis or cholecystitis without obstruction: K80.50

## 2013-03-13 HISTORY — DX: Calculus of gallbladder with chronic cholecystitis without obstruction: K80.10

## 2013-03-13 NOTE — Patient Instructions (Signed)
  [facility]  LAPAROSCOPIC SURGERY - POST-OP INSTRUCTIONS  Always review your discharge instruction sheet given to you by the facility where your surgery was performed.  A prescription for pain medication may be given to you upon discharge.  Take your pain medication as prescribed.  If narcotic pain medicine is not needed, then you may take acetaminophen (Tylenol) or ibuprofen (Advil) as needed.  Take your usually prescribed medications unless otherwise directed.  If you need a refill on your pain medication, please contact your pharmacy.  They will contact our office to request authorization. Prescriptions will not be filled after 5 P.M. or on weekends.  You should follow a light diet the first few days after arrival home, such as soup and crackers or toast.  Be sure to include plenty of fluids daily.  Most patients will experience some swelling and bruising in the area of the incisions.  Ice packs will help.  Swelling and bruising can take several days to resolve.   It is common to experience some constipation if taking pain medication after surgery.  Increasing fluid intake and taking a stool softener (such as Colace) will usually help or prevent this problem from occurring.  A mild laxative (Milk of Magnesia or Miralax) should be taken according to package instructions if there are no bowel movements after 48 hours.  Unless discharge instructions indicate otherwise, you may remove your bandages 24-48 hours after surgery, and you may shower at that time.  You may have steri-strips (small skin tapes) in place directly over the incision.  These strips should be left on the skin for 7-10 days.  If your surgeon used skin glue on the incision, you may shower in 24 hours.  The glue will flake off over the next 2-3 weeks.  Any sutures or staples will be removed at the office during your follow-up visit.  ACTIVITIES:  You may resume regular (light) daily activities beginning the  next day-such as daily self-care, walking, climbing stairs-gradually increasing activities as tolerated.  You may have sexual intercourse when it is comfortable.  Refrain from any heavy lifting or straining until approved by your doctor.  You may drive when you are no longer taking prescription pain medication, you can comfortably wear a seatbelt, and you can safely maneuver your car and apply brakes.  You should see your doctor in the office for a follow-up appointment approximately 2-3 weeks after your surgery.  Make sure that you call for this appointment within a day or two after you arrive home to insure a convenient appointment time.  WHEN TO CALL YOUR DOCTOR: 1. Fever over 101.0 2. Inability to urinate 3. Continued bleeding from incision 4. Increased pain, redness, or drainage from the incision 5. Increasing abdominal pain  The clinic staff is available to answer your questions during regular business hours.  Please don't hesitate to call and ask to speak to one of the nurses for clinical concerns.  If you have a medical emergency, go to the nearest emergency room or call 911.  A surgeon from [facility] is always on call for the hospital.  Robina Hamor M. Larayne Baxley, MD, FACS [facility] Office: [phone] Toll Free:  [phone] [phone]  Web site: [email or web address] 

## 2013-03-13 NOTE — Progress Notes (Signed)
General Surgery - [facility]  Chief Complaint  Patient presents with  . New Evaluation    gallstones, hx of CBD stone - for cholecystectomy - referral from Dr. Patrick Hung    HISTORY: Patient is a 47-year-old white female who developed abdominal pain approximately one month ago. Pain was intermittent. She was evaluated in the emergency department. Ultrasound demonstrated multiple gallstones. Patient had recurrent episodes of pain and further evaluation revealed abnormal liver function test. She was seen by gastroenterology and underwent ERCP with stone extraction on 03/03/2013. Patient is now referred for cholecystectomy for cholelithiasis and history of choledocholithiasis.  Patient has no prior history of hepatobiliary disease. She has had no prior abdominal surgery except for a vaginal hysterectomy.  Past Medical History  Diagnosis Date  . Hypertension   . Panic disorder   . Depression   . Arthritis   . GERD (gastroesophageal reflux disease)   . Gallstones      Current Outpatient Prescriptions  Medication Sig Dispense Refill  . ALPRAZolam (XANAX) 0.5 MG tablet Take 0.5 mg by mouth 3 (three) times daily. For anxiety      . atenolol (TENORMIN) 50 MG tablet Take 50 mg by mouth daily.      . citalopram (CELEXA) 20 MG tablet Take 20 mg by mouth daily.      . lisinopril-hydrochlorothiazide (PRINZIDE,ZESTORETIC) 20-25 MG per tablet Take 0.5 tablets by mouth daily.       . ranitidine (ZANTAC) 150 MG tablet Take 150 mg by mouth 2 (two) times daily.       No current facility-administered medications for this visit.     No Known Allergies   Family History  Problem Relation Age of Onset  . Hypertension Maternal Aunt      History   Social History  . Marital Status: Legally Separated    Spouse Name: N/A    Number of Children: N/A  . Years of Education: N/A   Social History Main Topics  . Smoking status: Current Every Day Smoker -- 1.00 packs/day for 30  years    Types: Cigarettes  . Smokeless tobacco: None  . Alcohol Use: No  . Drug Use: No  . Sexually Active: None   Other Topics Concern  . None   Social History Narrative  . None     REVIEW OF SYSTEMS - PERTINENT POSITIVES ONLY: Denies jaundice. Denies acholic stools. Denies previous abdominal surgery. Denies a hepatobiliary disease.  EXAM: Filed Vitals:   03/13/13 0937  BP: 138/84  Pulse: 72  Temp: 97.3 F (36.3 C)  Resp: 16    HEENT: normocephalic; pupils equal and reactive; sclerae clear; dentition good; mucous membranes moist NECK:  symmetric on extension; no palpable anterior or posterior cervical lymphadenopathy; no supraclavicular masses; no tenderness CHEST: clear to auscultation bilaterally without rales, rhonchi, or wheezes CARDIAC: regular rate and rhythm without significant murmur; peripheral pulses are full ABDOMEN: soft without distension; bowel sounds present; no mass; no hepatosplenomegaly; no hernia EXT:  non-tender without edema; no deformity NEURO: no gross focal deficits; no sign of tremor   LABORATORY RESULTS: See [facility] (CHL-Epic) for most recent results   RADIOLOGY RESULTS: See [facility] (CHL-Epic) for most recent results   IMPRESSION: #1 cholelithiasis, probable chronic cholecystitis #2 history of choledocholithiasis #3 morbid obesity  PLAN: The patient and I discussed the findings on her ultrasound and the results of her ERCP. I provided her with written literature to review the anatomy of the biliary tract and   I explained laparoscopic cholecystectomy. We discussed risk and benefits of the procedure including the possibility of conversion to open surgery. We discussed the hospital stay to be anticipated and the postoperative recovery. The patient would like to proceed with surgery in the near future.  The risks and benefits of the procedure have been discussed at length with the patient.  The patient understands the  proposed procedure, potential alternative treatments, and the course of recovery to be expected.  All of the patient's questions have been answered at this time.  The patient wishes to proceed with surgery.  Dresden Lozito M. Everado Pillsbury, MD, FACS General & Endocrine Surgery [facility]   Visit Diagnoses: 1. Cholelithiasis with cholecystitis   2. Choledocholithiasis, status post ERCP     Primary Care Physician: Default, Provider, MD   

## 2013-03-14 ENCOUNTER — Ambulatory Visit (HOSPITAL_COMMUNITY): Payer: Self-pay

## 2013-03-14 ENCOUNTER — Ambulatory Visit (HOSPITAL_COMMUNITY): Payer: Self-pay | Admitting: Anesthesiology

## 2013-03-14 ENCOUNTER — Observation Stay (HOSPITAL_COMMUNITY)
Admission: RE | Admit: 2013-03-14 | Discharge: 2013-03-15 | Disposition: A | Discharge status: Home / Self Care | Payer: Self-pay | Source: Ambulatory Visit | Attending: Surgery | Admitting: Surgery

## 2013-03-14 ENCOUNTER — Encounter (HOSPITAL_COMMUNITY)
Admission: RE | Disposition: A | Discharge status: Home / Self Care | Payer: Self-pay | Source: Ambulatory Visit | Attending: Surgery

## 2013-03-14 ENCOUNTER — Encounter (HOSPITAL_COMMUNITY): Payer: Self-pay

## 2013-03-14 ENCOUNTER — Encounter (HOSPITAL_COMMUNITY): Payer: Self-pay | Admitting: Anesthesiology

## 2013-03-14 DIAGNOSIS — K8010 Calculus of gallbladder with chronic cholecystitis without obstruction: Secondary | ICD-10-CM

## 2013-03-14 DIAGNOSIS — R7989 Other specified abnormal findings of blood chemistry: Secondary | ICD-10-CM | POA: Insufficient documentation

## 2013-03-14 DIAGNOSIS — Z79899 Other long term (current) drug therapy: Secondary | ICD-10-CM | POA: Insufficient documentation

## 2013-03-14 DIAGNOSIS — K219 Gastro-esophageal reflux disease without esophagitis: Secondary | ICD-10-CM | POA: Insufficient documentation

## 2013-03-14 DIAGNOSIS — K8060 Calculus of gallbladder and bile duct with cholecystitis, unspecified, without obstruction: Principal | ICD-10-CM | POA: Insufficient documentation

## 2013-03-14 DIAGNOSIS — I10 Essential (primary) hypertension: Secondary | ICD-10-CM | POA: Insufficient documentation

## 2013-03-14 DIAGNOSIS — K8064 Calculus of gallbladder and bile duct with chronic cholecystitis without obstruction: Secondary | ICD-10-CM | POA: Insufficient documentation

## 2013-03-14 HISTORY — DX: Cardiac arrhythmia, unspecified: I49.9

## 2013-03-14 HISTORY — PX: CHOLECYSTECTOMY: SHX55

## 2013-03-14 LAB — SURGICAL PCR SCREEN
MRSA, PCR: NEGATIVE
Staphylococcus aureus: NEGATIVE

## 2013-03-14 SURGERY — LAPAROSCOPIC CHOLECYSTECTOMY WITH INTRAOPERATIVE CHOLANGIOGRAM
Anesthesia: General | Wound class: Clean Contaminated

## 2013-03-14 MED ORDER — LACTATED RINGERS IV SOLN: INTRAVENOUS | Status: DC

## 2013-03-14 MED ORDER — ONDANSETRON HCL 4 MG PO TABS: 4.0000 mg | ORAL_TABLET | Freq: Four times a day (QID) | ORAL | Status: DC | PRN

## 2013-03-14 MED ORDER — BUPIVACAINE-EPINEPHRINE 0.5% -1:200000 IJ SOLN
INTRAMUSCULAR | Status: DC | PRN
  Administered 2013-03-14: 21 mL

## 2013-03-14 MED ORDER — LISINOPRIL-HYDROCHLOROTHIAZIDE 20-25 MG PO TABS: 0.5000 | ORAL_TABLET | Freq: Every morning | ORAL | Status: DC

## 2013-03-14 MED ORDER — HYDROMORPHONE HCL PF 1 MG/ML IJ SOLN
1.0000 mg | INTRAMUSCULAR | Status: DC | PRN
  Administered 2013-03-14: 1 mg via INTRAVENOUS
  Filled 2013-03-14: qty 1

## 2013-03-14 MED ORDER — MUPIROCIN 2 % EX OINT
TOPICAL_OINTMENT | Freq: Two times a day (BID) | CUTANEOUS | Status: DC
  Administered 2013-03-14: 1 via NASAL
  Filled 2013-03-14: qty 22

## 2013-03-14 MED ORDER — MEPERIDINE HCL 50 MG/ML IJ SOLN: 6.2500 mg | INTRAMUSCULAR | Status: DC | PRN

## 2013-03-14 MED ORDER — MIDAZOLAM HCL 5 MG/5ML IJ SOLN
INTRAMUSCULAR | Status: DC | PRN
  Administered 2013-03-14 (×2): 1 mg via INTRAVENOUS

## 2013-03-14 MED ORDER — HYDROCODONE-ACETAMINOPHEN 5-325 MG PO TABS
1.0000 | ORAL_TABLET | ORAL | Status: DC | PRN
Start: 2013-03-14 — End: 2013-03-15
  Administered 2013-03-14 – 2013-03-15 (×2): 1 via ORAL
  Filled 2013-03-14 (×2): qty 1

## 2013-03-14 MED ORDER — SUCCINYLCHOLINE CHLORIDE 20 MG/ML IJ SOLN
INTRAMUSCULAR | Status: DC | PRN
  Administered 2013-03-14: 100 mg via INTRAVENOUS

## 2013-03-14 MED ORDER — EPHEDRINE SULFATE 50 MG/ML IJ SOLN
INTRAMUSCULAR | Status: DC | PRN
  Administered 2013-03-14 (×2): 10 mg via INTRAVENOUS
  Administered 2013-03-14: 5 mg via INTRAVENOUS

## 2013-03-14 MED ORDER — DEXAMETHASONE SODIUM PHOSPHATE 10 MG/ML IJ SOLN
INTRAMUSCULAR | Status: DC | PRN
  Administered 2013-03-14: 10 mg via INTRAVENOUS

## 2013-03-14 MED ORDER — 0.9 % SODIUM CHLORIDE (POUR BTL) OPTIME
TOPICAL | Status: DC | PRN
  Administered 2013-03-14: 1000 mL

## 2013-03-14 MED ORDER — LACTATED RINGERS IV SOLN
INTRAVENOUS | Status: DC
  Administered 2013-03-14: 1000 mL via INTRAVENOUS
  Administered 2013-03-14: 11:00:00 via INTRAVENOUS

## 2013-03-14 MED ORDER — CEFAZOLIN SODIUM-DEXTROSE 2-3 GM-% IV SOLR
2.0000 g | INTRAVENOUS | Status: AC
  Administered 2013-03-14: 2 g via INTRAVENOUS

## 2013-03-14 MED ORDER — IOHEXOL 300 MG/ML  SOLN
INTRAMUSCULAR | Status: AC
  Filled 2013-03-14: qty 1

## 2013-03-14 MED ORDER — KCL IN DEXTROSE-NACL 20-5-0.45 MEQ/L-%-% IV SOLN
INTRAVENOUS | Status: DC
  Administered 2013-03-14: 16:00:00 via INTRAVENOUS
  Filled 2013-03-14 (×2): qty 1000

## 2013-03-14 MED ORDER — ATENOLOL 50 MG PO TABS
50.0000 mg | ORAL_TABLET | Freq: Every morning | ORAL | Status: DC
  Filled 2013-03-14: qty 1

## 2013-03-14 MED ORDER — BUPIVACAINE-EPINEPHRINE (PF) 0.5% -1:200000 IJ SOLN
INTRAMUSCULAR | Status: AC
  Filled 2013-03-14: qty 10

## 2013-03-14 MED ORDER — FENTANYL CITRATE 0.05 MG/ML IJ SOLN
INTRAMUSCULAR | Status: AC
  Filled 2013-03-14: qty 2

## 2013-03-14 MED ORDER — FENTANYL CITRATE 0.05 MG/ML IJ SOLN
INTRAMUSCULAR | Status: DC | PRN
  Administered 2013-03-14 (×5): 50 ug via INTRAVENOUS

## 2013-03-14 MED ORDER — PROPOFOL 10 MG/ML IV EMUL
INTRAVENOUS | Status: DC | PRN
  Administered 2013-03-14: 175 mg via INTRAVENOUS

## 2013-03-14 MED ORDER — ONDANSETRON HCL 4 MG/2ML IJ SOLN: 4.0000 mg | Freq: Four times a day (QID) | INTRAMUSCULAR | Status: DC | PRN

## 2013-03-14 MED ORDER — FENTANYL CITRATE 0.05 MG/ML IJ SOLN
25.0000 ug | INTRAMUSCULAR | Status: DC | PRN
  Administered 2013-03-14 (×2): 50 ug via INTRAVENOUS

## 2013-03-14 MED ORDER — ONDANSETRON HCL 4 MG/2ML IJ SOLN
INTRAMUSCULAR | Status: DC | PRN
  Administered 2013-03-14 (×2): 2 mg via INTRAVENOUS

## 2013-03-14 MED ORDER — CISATRACURIUM BESYLATE (PF) 10 MG/5ML IV SOLN
INTRAVENOUS | Status: DC | PRN
  Administered 2013-03-14: 6 mg via INTRAVENOUS
  Administered 2013-03-14: 2 mg via INTRAVENOUS

## 2013-03-14 MED ORDER — PANTOPRAZOLE SODIUM 40 MG PO TBEC
40.0000 mg | DELAYED_RELEASE_TABLET | Freq: Every day | ORAL | Status: DC
  Administered 2013-03-15: 40 mg via ORAL
  Filled 2013-03-14: qty 1

## 2013-03-14 MED ORDER — SODIUM CHLORIDE 0.9 % IJ SOLN
INTRAMUSCULAR | Status: DC | PRN
  Administered 2013-03-14: 11:00:00

## 2013-03-14 MED ORDER — LACTATED RINGERS IR SOLN
Status: DC | PRN
  Administered 2013-03-14: 1

## 2013-03-14 MED ORDER — LISINOPRIL 20 MG PO TABS
20.0000 mg | ORAL_TABLET | Freq: Every day | ORAL | Status: DC
  Administered 2013-03-14: 20 mg via ORAL
  Filled 2013-03-14 (×2): qty 1

## 2013-03-14 MED ORDER — LIDOCAINE HCL (CARDIAC) 20 MG/ML IV SOLN
INTRAVENOUS | Status: DC | PRN
  Administered 2013-03-14: 75 mg via INTRAVENOUS

## 2013-03-14 MED ORDER — ACETAMINOPHEN 325 MG PO TABS: 650.0000 mg | ORAL_TABLET | ORAL | Status: DC | PRN

## 2013-03-14 MED ORDER — HYDROCHLOROTHIAZIDE 25 MG PO TABS
25.0000 mg | ORAL_TABLET | Freq: Every day | ORAL | Status: DC
  Administered 2013-03-14: 25 mg via ORAL
  Filled 2013-03-14 (×2): qty 1

## 2013-03-14 MED ORDER — CITALOPRAM HYDROBROMIDE 20 MG PO TABS
20.0000 mg | ORAL_TABLET | Freq: Every morning | ORAL | Status: DC
  Administered 2013-03-14 – 2013-03-15 (×2): 20 mg via ORAL
  Filled 2013-03-14 (×3): qty 1

## 2013-03-14 MED ORDER — ALPRAZOLAM 0.25 MG PO TABS
0.2500 mg | ORAL_TABLET | Freq: Three times a day (TID) | ORAL | Status: DC
  Administered 2013-03-14 – 2013-03-15 (×3): 0.25 mg via ORAL
  Filled 2013-03-14 (×3): qty 1

## 2013-03-14 MED ORDER — PROMETHAZINE HCL 25 MG/ML IJ SOLN: 6.2500 mg | INTRAMUSCULAR | Status: DC | PRN

## 2013-03-14 SURGICAL SUPPLY — 38 items
APL SKNCLS STERI-STRIP NONHPOA (GAUZE/BANDAGES/DRESSINGS) ×1
APPLIER CLIP ROT 10 11.4 M/L (STAPLE) ×2
APR CLP MED LRG 11.4X10 (STAPLE) ×1
BAG SPEC RTRVL LRG 6X4 10 (ENDOMECHANICALS) ×1
BENZOIN TINCTURE PRP APPL 2/3 (GAUZE/BANDAGES/DRESSINGS) ×2 IMPLANT
CABLE HIGH FREQUENCY MONO STRZ (ELECTRODE) ×1 IMPLANT
CANISTER SUCTION 2500CC (MISCELLANEOUS) ×2 IMPLANT
CHLORAPREP W/TINT 26ML (MISCELLANEOUS) ×2 IMPLANT
CLIP APPLIE ROT 10 11.4 M/L (STAPLE) ×1 IMPLANT
CLOTH BEACON ORANGE TIMEOUT ST (SAFETY) ×2 IMPLANT
COVER MAYO STAND STRL (DRAPES) ×2 IMPLANT
DECANTER SPIKE VIAL GLASS SM (MISCELLANEOUS) ×1 IMPLANT
DRAPE C-ARM 42X72 X-RAY (DRAPES) ×2 IMPLANT
DRAPE LAPAROSCOPIC ABDOMINAL (DRAPES) ×2 IMPLANT
DRAPE UTILITY 15X26 (DRAPE) ×2 IMPLANT
ELECT REM PT RETURN 9FT ADLT (ELECTROSURGICAL) ×2
ELECTRODE REM PT RTRN 9FT ADLT (ELECTROSURGICAL) ×1 IMPLANT
GLOVE BIOGEL PI IND STRL 7.0 (GLOVE) ×1 IMPLANT
GLOVE BIOGEL PI INDICATOR 7.0 (GLOVE) ×1
GLOVE SURG ORTHO 8.0 STRL STRW (GLOVE) ×2 IMPLANT
GOWN STRL NON-REIN LRG LVL3 (GOWN DISPOSABLE) ×1 IMPLANT
GOWN STRL REIN XL XLG (GOWN DISPOSABLE) ×7 IMPLANT
HEMOSTAT SURGICEL 4X8 (HEMOSTASIS) IMPLANT
KIT BASIN OR (CUSTOM PROCEDURE TRAY) ×2 IMPLANT
NS IRRIG 1000ML POUR BTL (IV SOLUTION) ×2 IMPLANT
POUCH SPECIMEN RETRIEVAL 10MM (ENDOMECHANICALS) ×2 IMPLANT
SCISSORS LAP 5X35 DISP (ENDOMECHANICALS) IMPLANT
SET CHOLANGIOGRAPH MIX (MISCELLANEOUS) ×2 IMPLANT
SET IRRIG TUBING LAPAROSCOPIC (IRRIGATION / IRRIGATOR) ×2 IMPLANT
SOLUTION ANTI FOG 6CC (MISCELLANEOUS) ×2 IMPLANT
STRIP CLOSURE SKIN 1/2X4 (GAUZE/BANDAGES/DRESSINGS) ×2 IMPLANT
SUT MNCRL AB 4-0 PS2 18 (SUTURE) ×2 IMPLANT
TOWEL OR 17X26 10 PK STRL BLUE (TOWEL DISPOSABLE) ×4 IMPLANT
TRAY LAP CHOLE (CUSTOM PROCEDURE TRAY) ×2 IMPLANT
TROCAR XCEL BLUNT TIP 100MML (ENDOMECHANICALS) ×2 IMPLANT
TROCAR Z-THREAD FIOS 11X100 BL (TROCAR) ×2 IMPLANT
TROCAR Z-THREAD FIOS 5X100MM (TROCAR) ×4 IMPLANT
TUBING INSUFFLATION 10FT LAP (TUBING) ×2 IMPLANT

## 2013-03-14 NOTE — Transfer of Care (Signed)
Immediate Anesthesia Transfer of Care Note  Patient: [name]  Procedure(s) Performed: Procedure(s): LAPAROSCOPIC CHOLECYSTECTOMY WITH INTRAOPERATIVE CHOLANGIOGRAM (N/A)  Patient Location: PACU  Anesthesia Type:General  Level of Consciousness: awake, alert , oriented and patient cooperative  Airway & Oxygen Therapy: Patient Spontanous Breathing and Patient connected to face mask oxygen  Post-op Assessment: Report given to PACU RN, Post -op Vital signs reviewed and stable and Patient moving all extremities  Post vital signs: Reviewed and stable  Complications: No apparent anesthesia complications

## 2013-03-14 NOTE — Preoperative (Signed)
Beta Blockers   Reason not to administer Beta Blockers:Not Applicablept took beta blocker this a.m. 

## 2013-03-14 NOTE — H&P (View-Only) (Signed)
General Surgery [facility]  Chief Complaint  Patient presents with  . New Evaluation    gallstones, hx of CBD stone - for cholecystectomy - referral from Dr. Jeani Hawking    HISTORY: Patient is a 47 year old white female who developed abdominal pain approximately one month ago. Pain was intermittent. She was evaluated in the emergency department. Ultrasound demonstrated multiple gallstones. Patient had recurrent episodes of pain and further evaluation revealed abnormal liver function test. She was seen by gastroenterology and underwent ERCP with stone extraction on 03/03/2013. Patient is now referred for cholecystectomy for cholelithiasis and history of choledocholithiasis.  Patient has no prior history of hepatobiliary disease. She has had no prior abdominal surgery except for a vaginal hysterectomy.  Past Medical History  Diagnosis Date  . Hypertension   . Panic disorder   . Depression   . Arthritis   . GERD (gastroesophageal reflux disease)   . Gallstones      Current Outpatient Prescriptions  Medication Sig Dispense Refill  . ALPRAZolam (XANAX) 0.5 MG tablet Take 0.5 mg by mouth 3 (three) times daily. For anxiety      . atenolol (TENORMIN) 50 MG tablet Take 50 mg by mouth daily.      . citalopram (CELEXA) 20 MG tablet Take 20 mg by mouth daily.      Marland Kitchen lisinopril-hydrochlorothiazide (PRINZIDE,ZESTORETIC) 20-25 MG per tablet Take 0.5 tablets by mouth daily.       . ranitidine (ZANTAC) 150 MG tablet Take 150 mg by mouth 2 (two) times daily.       No current facility-administered medications for this visit.     No Known Allergies   Family History  Problem Relation Age of Onset  . Hypertension Maternal Aunt      History   Social History  . Marital Status: Legally Separated    Spouse Name: N/A    Number of Children: N/A  . Years of Education: N/A   Social History Main Topics  . Smoking status: Current Every Day Smoker -- 1.00 packs/day for 30  years    Types: Cigarettes  . Smokeless tobacco: None  . Alcohol Use: No  . Drug Use: No  . Sexually Active: None   Other Topics Concern  . None   Social History Narrative  . None     REVIEW OF SYSTEMS - PERTINENT POSITIVES ONLY: Denies jaundice. Denies acholic stools. Denies previous abdominal surgery. Denies a hepatobiliary disease.  EXAM: Filed Vitals:   03/13/13 0937  BP: 138/84  Pulse: 72  Temp: 97.3 F (36.3 C)  Resp: 16    HEENT: normocephalic; pupils equal and reactive; sclerae clear; dentition good; mucous membranes moist NECK:  symmetric on extension; no palpable anterior or posterior cervical lymphadenopathy; no supraclavicular masses; no tenderness CHEST: clear to auscultation bilaterally without rales, rhonchi, or wheezes CARDIAC: regular rate and rhythm without significant murmur; peripheral pulses are full ABDOMEN: soft without distension; bowel sounds present; no mass; no hepatosplenomegaly; no hernia EXT:  non-tender without edema; no deformity NEURO: no gross focal deficits; no sign of tremor   LABORATORY RESULTS: See [facility] (CHL-Epic) for most recent results   RADIOLOGY RESULTS: See [facility] (CHL-Epic) for most recent results   IMPRESSION: #1 cholelithiasis, probable chronic cholecystitis #2 history of choledocholithiasis #3 morbid obesity  PLAN: The patient and I discussed the findings on her ultrasound and the results of her ERCP. I provided her with written literature to review the anatomy of the biliary tract and  I explained laparoscopic cholecystectomy. We discussed risk and benefits of the procedure including the possibility of conversion to open surgery. We discussed the hospital stay to be anticipated and the postoperative recovery. The patient would like to proceed with surgery in the near future.  The risks and benefits of the procedure have been discussed at length with the patient.  The patient understands the  proposed procedure, potential alternative treatments, and the course of recovery to be expected.  All of the patient's questions have been answered at this time.  The patient wishes to proceed with surgery.  Velora Heckler, MD, FACS General & Endocrine Surgery [facility]   Visit Diagnoses: 1. Cholelithiasis with cholecystitis   2. Choledocholithiasis, status post ERCP     Primary Care Physician: Default, Provider, MD

## 2013-03-14 NOTE — Op Note (Signed)
Procedure Note  Pre-operative Diagnosis: Calculus of gallbladder and bile duct with other cholecystitis without mention of obstruction  Post-operative Diagnosis: Same  Surgeon:  Velora Heckler, MD, FACS  Procedure:  Laparoscopic cholecystectomy with intra-operative cholangiography  Assistant:  none   Anesthesia:  General  Indications: This patient presents with symptomatic gallbladder disease and will undergo laparoscopic cholecystectomy with intraoperative cholangiography.  Procedure Details: The patient was seen in the pre-op holding area. The risks, benefits, complications, treatment options, and expected outcomes have been discussed with the patient. The patient and/or family agreed with the proposed plan and signed the informed consent form.  The patient was taken to Operating Room, identified as [place] and the procedure verified as Laparoscopic Cholecystectomy with Intraoperative Cholangiogram. A "time out" was completed and the above information confirmed.  Prior to the induction of general anesthesia, antibiotic prophylaxis was administered. General endotracheal anesthesia was then administered and tolerated well. After the induction, the abdomen was prepped in the usual strict aseptic fashion. The patient was in the supine position.  An incision was made in the skin near the umbilicus. The midline fascia was incised and the peritoneal cavity entered and the Hasson canula was introduced under direct vision. Hasson canula was secured with a pursestring 0-Vicryl suture. Pneumoperitoneum was then established with carbon dioxide and tolerated well without any adverse changes in the patient's vital signs. Additional trocars were introduced under direct vision along the right costal margin in the midline, mid-clavicular line, and anterior axillary line.  The gallbladder was identified and the fundus grasped and retracted cephalad. Adhesions were taken down bluntly and with the  electrocautery as needed, taking care not to injure any adjacent structures. The infundibulum was grasped and retracted laterally, exposing the peritoneum overlying the triangle of Calot. This was incised and structures exposed in a blunt fashion. The cystic duct was clearly identified and bluntly dissected circumferentially and clipped at the neck of the gallbladder.  An incision was made in the cystic duct and the cholangiogram catheter introduced. The catheter was secured using an ligaclip.  Real-time cholangiography was performed using the C-arm.  There was rapid filling of a normal caliber common bile duct.  There was reflux of contrast into the left and right hepatic ductal systems.  There was free flow distally into the duodenum without filling defect or obstruction.  Catheter was removed from the peritoneal cavity.  The cystic duct was then triply ligated with surgical clips and divided. The cystic artery was identified, dissected circumferentially, ligated with ligaclips, and divided.   The gallbladder was dissected from the liver bed with the electrocautery used for hemostasis. The gallbladder was completely removed and placed into an endocatch bag. The right upper quadrant was irrigated and inspected. Hemostasis was achieved with the electrocautery. Warm saline irrigation was utilized and was repeatedly aspirated until clear.  Pneumoperitoneum was released after viewing removal of the trocars with good hemostasis noted. The umbilical wound was irrigated and the fascia was then closed with the pursestring suture.  The skin was then closed with 4-0 Monocril subcuticular sutures and sterile dressings were applied.  Instrument, sponge, and needle counts were correct at the conclusion of the case.  The patient tolerated the procedure well.  Estimated Blood Loss: Minimal         Drains: none         Specimens: Gallbladder to pathology         Disposition: PACU - hemodynamically stable.  Condition: stable   Velora Heckler, MD, [facility] Office: [phone]

## 2013-03-14 NOTE — Anesthesia Postprocedure Evaluation (Signed)
  Anesthesia Post-op Note  Patient: [name]  Procedure(s) Performed: Procedure(s) (LRB): LAPAROSCOPIC CHOLECYSTECTOMY WITH INTRAOPERATIVE CHOLANGIOGRAM (N/A)  Patient Location: PACU  Anesthesia Type: General  Level of Consciousness: awake and alert   Airway and Oxygen Therapy: Patient Spontanous Breathing  Post-op Pain: mild  Post-op Assessment: Post-op Vital signs reviewed, Patient's Cardiovascular Status Stable, Respiratory Function Stable, Patent Airway and No signs of Nausea or vomiting  Last Vitals:  Filed Vitals:   03/14/13 1200  BP: 114/63  Pulse: 83  Temp: 36.7 C  Resp: 17    Post-op Vital Signs: stable   Complications: No apparent anesthesia complications

## 2013-03-14 NOTE — Interval H&P Note (Signed)
History and Physical Interval Note:  03/14/2013 10:24 AM  [name] Pickerel  has presented today for surgery, with the diagnosis of gallstones.   The various methods of treatment have been discussed with the patient and family. After consideration of risks, benefits and other options for treatment, the patient has consented to    Procedure(s): LAPAROSCOPIC CHOLECYSTECTOMY WITH INTRAOPERATIVE CHOLANGIOGRAM (N/A) as a surgical intervention .    The patient's history has been reviewed, patient examined, no change in status, stable for surgery.  I have reviewed the patient's chart and labs.  Questions were answered to the patient's satisfaction.    Velora Heckler, MD, [facility] Office: [phone]    Aman Bonet Judie Petit

## 2013-03-14 NOTE — Anesthesia Preprocedure Evaluation (Signed)
Anesthesia Evaluation  Patient identified by MRN, date of birth, ID band Patient awake    Reviewed: Allergy & Precautions, H&P , NPO status , Patient's Chart, lab work & pertinent test results  Airway Mallampati: II TM Distance: >3 FB Neck ROM: Full    Dental no notable dental hx. (+) Poor Dentition, Loose and Dental Advisory Given   Pulmonary Current Smoker,  breath sounds clear to auscultation  Pulmonary exam normal       Cardiovascular hypertension, Pt. on medications - dysrhythmias Rhythm:Regular Rate:Normal     Neuro/Psych PSYCHIATRIC DISORDERS Anxiety Depression negative neurological ROS     GI/Hepatic Neg liver ROS, GERD-  ,  Endo/Other  negative endocrine ROS  Renal/GU negative Renal ROS  negative genitourinary   Musculoskeletal negative musculoskeletal ROS (+)   Abdominal   Peds negative pediatric ROS (+)  Hematology negative hematology ROS (+)   Anesthesia Other Findings Multiple loose teeth upper and lower. Decay prevalent.  Reproductive/Obstetrics negative OB ROS                           Anesthesia Physical Anesthesia Plan  ASA: III  Anesthesia Plan: General   Post-op Pain Management:    Induction: Intravenous, Rapid sequence and Cricoid pressure planned  Airway Management Planned: Oral ETT  Additional Equipment:   Intra-op Plan:   Post-operative Plan: Extubation in OR  Informed Consent: I have reviewed the patients History and Physical, chart, labs and discussed the procedure including the risks, benefits and alternatives for the proposed anesthesia with the patient or authorized representative who has indicated his/her understanding and acceptance.   Dental advisory given  Plan Discussed with: CRNA  Anesthesia Plan Comments: (Risk of dental injury explained extensively)        Anesthesia Quick Evaluation

## 2013-03-15 ENCOUNTER — Encounter (HOSPITAL_COMMUNITY): Payer: Self-pay | Admitting: Surgery

## 2013-03-15 MED ORDER — HYDROCODONE-ACETAMINOPHEN 5-325 MG PO TABS: 1.0000 | ORAL_TABLET | ORAL | Status: DC | PRN

## 2013-03-15 NOTE — Care Management Note (Signed)
    Page 1 of 1   03/15/2013     10:10:32 AM   CARE MANAGEMENT NOTE 03/15/2013  Patient:  DARELY, BECKNELL   Account Number:  [id number]  Date Initiated:  03/15/2013  Documentation initiated by:  Lorenda Ishihara  Subjective/Objective Assessment:   47 yo female admitted s/p lap chole. PTA lived at home with spouse.     Action/Plan:   Home when stable   Anticipated DC Date:  03/15/2013   Anticipated DC Plan:  HOME/SELF CARE  In-house referral  PCP / [facility]      DC Planning Services  CM consult  PCP issues      Choice offered to / List presented to:             Status of service:  Completed, signed off Medicare Important Message given?   (If response is "NO", the following Medicare IM given date fields will be blank) Date Medicare IM given:   Date Additional Medicare IM given:    Discharge Disposition:  HOME/SELF CARE  Per UR Regulation:  Reviewed for med. necessity/level of care/duration of stay  If discussed at Long Length of Stay Meetings, dates discussed:    Comments:  03-15-13 Lorenda Ishihara RN CM 1000 Noted need for PCP on admission history. Spoke with patient at bedside. Provided written resources to obtain PCP. Patient is aware of [facility] and plans to contact them to connect with a PCP. Patient appreciative of resources and support.

## 2013-03-15 NOTE — Discharge Summary (Signed)
Physician Discharge Summary [facility]  Patient ID: [name] MRN: [id number] DOB/AGE: [birth year] 47 y.o.  Admit date: 03/14/2013 Discharge date: 03/15/2013  Admission Diagnoses:  Symptomatic cholelithiasis, hx of choledocholithiasis  Discharge Diagnoses:  Principal Problem:   Cholelithiasis with cholecystitis   Discharged Condition: good  Hospital Course: patient admitted for observation after lap chole with IOC.  Post op course stable.  Tolerated diet.  Pain controlled.  Prepared for discharge AM after surgery, POD#1.  Consults: None  Significant Diagnostic Studies: none  Treatments: surgery: lap cholecystectomy with IOC  Discharge Exam: Blood pressure 114/73, pulse 79, temperature 98 F (36.7 C), temperature source Oral, resp. rate 18, height 5' 8.5" (1.74 m), weight 246 lb (111.585 kg), SpO2 95.00%. See progress notes  Disposition: Home with family  Discharge Orders   Future Orders Complete By Expires     Diet - low sodium heart healthy  As directed     Discharge instructions  As directed     Comments:      [facility]  LAPAROSCOPIC SURGERY - POST-OP INSTRUCTIONS  Always review your discharge instruction sheet given to you by the facility where your surgery was performed.  A prescription for pain medication may be given to you upon discharge.  Take your pain medication as prescribed.  If narcotic pain medicine is not needed, then you may take acetaminophen (Tylenol) or ibuprofen (Advil) as needed.  Take your usually prescribed medications unless otherwise directed.  If you need a refill on your pain medication, please contact your pharmacy.  They will contact our office to request authorization. Prescriptions will not be filled after 5 P.M. or on weekends.  You should follow a light diet the first few days after arrival home, such as soup and crackers or toast.  Be sure to include plenty of fluids daily.  Most patients  will experience some swelling and bruising in the area of the incisions.  Ice packs will help.  Swelling and bruising can take several days to resolve.   It is common to experience some constipation if taking pain medication after surgery.  Increasing fluid intake and taking a stool softener (such as Colace) will usually help or prevent this problem from occurring.  A mild laxative (Milk of Magnesia or Miralax) should be taken according to package instructions if there are no bowel movements after 48 hours.  Unless discharge instructions indicate otherwise, you may remove your bandages 24-48 hours after surgery, and you may shower at that time.  You may have steri-strips (small skin tapes) in place directly over the incision.  These strips should be left on the skin for 7-10 days.  If your surgeon used skin glue on the incision, you may shower in 24 hours.  The glue will flake off over the next 2-3 weeks.  Any sutures or staples will be removed at the office during your follow-up visit.  ACTIVITIES:  You may resume regular (light) daily activities beginning the next day-such as daily self-care, walking, climbing stairs-gradually increasing activities as tolerated.  You may have sexual intercourse when it is comfortable.  Refrain from any heavy lifting or straining until approved by your doctor.  You may drive when you are no longer taking prescription pain medication, you can comfortably wear a seatbelt, and you can safely maneuver your car and apply brakes.  You should see your doctor in the office for a follow-up appointment approximately 2-3 weeks after your surgery.  Make sure that  you call for this appointment within a day or two after you arrive home to insure a convenient appointment time.  WHEN TO CALL YOUR DOCTOR: Fever over 101.0 Inability to urinate Continued bleeding from incision Increased pain, redness, or drainage from the incision Increasing abdominal pain  The clinic staff is  available to answer your questions during regular business hours.  Please don't hesitate to call and ask to speak to one of the nurses for clinical concerns.  If you have a medical emergency, go to the nearest emergency room or call 911.  A surgeon from [facility] is always on call for the hospital.  Velora Heckler, MD, [facility] Office: [phone] Toll Free:  [phone] FAX [phone]  Web site: [email or web address]    Increase activity slowly  As directed     Remove dressing in 24 hours  As directed         Medication List    TAKE these medications       ALPRAZolam 0.5 MG tablet  Commonly known as:  XANAX  Take 0.25 mg by mouth 3 (three) times daily. For anxiety     atenolol 50 MG tablet  Commonly known as:  TENORMIN  Take 50 mg by mouth every morning.     citalopram 20 MG tablet  Commonly known as:  CELEXA  Take 20 mg by mouth every morning.     HYDROcodone-acetaminophen 5-325 MG per tablet  Commonly known as:  NORCO/VICODIN  Take 1-2 tablets by mouth every 4 (four) hours as needed for pain.     ibuprofen 200 MG tablet  Commonly known as:  ADVIL,MOTRIN  Take 400-600 mg by mouth every 6 (six) hours as needed for pain.     lisinopril-hydrochlorothiazide 20-25 MG per tablet  Commonly known as:  PRINZIDE,ZESTORETIC  Take 0.5 tablets by mouth every morning.     omeprazole 20 MG capsule  Commonly known as:  PRILOSEC  Take 20 mg by mouth daily.         Velora Heckler, MD, [facility] Office: [phone]   Signed: Velora Heckler 03/15/2013, 9:11 AM

## 2013-03-16 ENCOUNTER — Telehealth (INDEPENDENT_AMBULATORY_CARE_PROVIDER_SITE_OTHER): Payer: Self-pay

## 2013-03-16 NOTE — Telephone Encounter (Signed)
Pt home doing well. PO appt made. 

## 2013-03-23 ENCOUNTER — Telehealth (INDEPENDENT_AMBULATORY_CARE_PROVIDER_SITE_OTHER): Payer: Self-pay

## 2013-03-23 ENCOUNTER — Encounter (INDEPENDENT_AMBULATORY_CARE_PROVIDER_SITE_OTHER): Payer: Self-pay

## 2013-03-23 NOTE — Telephone Encounter (Signed)
Per pt request RTW note faxed to [facility] [phone].

## 2013-03-23 NOTE — Telephone Encounter (Signed)
Message copied by Joanette Gula on Thu Mar 23, 2013  9:23 AM ------      Message from: [facility]      Created: Wed Mar 22, 2013  2:57 PM      Contact: [phone]       She needs a note for work faxed to [phone] attn:Barbara long she is needing to go back to work on Friday night. ------

## 2013-03-27 ENCOUNTER — Encounter (INDEPENDENT_AMBULATORY_CARE_PROVIDER_SITE_OTHER): Payer: Self-pay

## 2013-04-05 ENCOUNTER — Telehealth (INDEPENDENT_AMBULATORY_CARE_PROVIDER_SITE_OTHER): Payer: Self-pay

## 2013-04-05 ENCOUNTER — Encounter (INDEPENDENT_AMBULATORY_CARE_PROVIDER_SITE_OTHER): Payer: Self-pay | Admitting: Surgery

## 2013-04-05 NOTE — Telephone Encounter (Signed)
Left msg for pt to call re: appt. When pt calls back pt can be advised Dr [name] is off next week and next avail appt will be 5-14 arrive at 12:00noon. Appt is in system.

## 2013-04-19 ENCOUNTER — Ambulatory Visit (INDEPENDENT_AMBULATORY_CARE_PROVIDER_SITE_OTHER): Payer: Self-pay | Admitting: Surgery

## 2013-04-19 ENCOUNTER — Encounter (INDEPENDENT_AMBULATORY_CARE_PROVIDER_SITE_OTHER): Payer: Self-pay | Admitting: Surgery

## 2013-04-19 VITALS — BP 110/80 | HR 84 | Temp 96.6°F | Resp 18 | Ht 68.0 in | Wt 245.4 lb

## 2013-04-19 DIAGNOSIS — K8050 Calculus of bile duct without cholangitis or cholecystitis without obstruction: Secondary | ICD-10-CM

## 2013-04-19 DIAGNOSIS — K8010 Calculus of gallbladder with chronic cholecystitis without obstruction: Secondary | ICD-10-CM

## 2013-04-19 NOTE — Progress Notes (Signed)
General Surgery [facility]  Visit Diagnoses: 1. Cholelithiasis with cholecystitis   2. Choledocholithiasis, status post ERCP     HISTORY: Patient returns having undergone laparoscopic cholecystectomy on 03/14/2013 for cholelithiasis with a history of choledocholithiasis.  Postoperative course has been uneventful.  EXAM: Abdomen is soft nontender without distention. Surgical wounds are well healed. Right upper quadrant is soft without palpable mass. Umbilical wound is well-healed without sign of herniation.  IMPRESSION: Status post laparoscopic cholecystectomy for chronic cholecystitis and cholelithiasis.  PLAN: Patient will begin applying topical creams to her incisions. She is released to full activity without restriction. She will return for surgical care as needed.  Velora Heckler, MD, FACS General & Endocrine Surgery [facility]

## 2013-04-19 NOTE — Patient Instructions (Signed)
  COCOA BUTTER & VITAMIN E CREAM  ([employer] or other brand)  Apply cocoa butter/vitamin E cream to your incision 2 - 3 times daily.  Massage cream into incision for one minute with each application.  Use sunscreen (50 SPF or higher) for first 6 months after surgery if area is exposed to sun.  You may substitute Mederma or other scar reducing creams as desired.   

## 2013-08-10 ENCOUNTER — Ambulatory Visit: Payer: No Typology Code available for payment source | Attending: Internal Medicine

## 2013-08-22 ENCOUNTER — Ambulatory Visit: Payer: Self-pay | Admitting: Internal Medicine

## 2013-08-23 ENCOUNTER — Encounter: Payer: Self-pay | Admitting: Internal Medicine

## 2013-08-23 ENCOUNTER — Ambulatory Visit: Payer: No Typology Code available for payment source | Attending: Internal Medicine | Admitting: Internal Medicine

## 2013-08-23 VITALS — BP 112/73 | HR 62 | Temp 98.2°F | Resp 16 | Ht 68.0 in | Wt 242.0 lb

## 2013-08-23 DIAGNOSIS — K219 Gastro-esophageal reflux disease without esophagitis: Secondary | ICD-10-CM

## 2013-08-23 DIAGNOSIS — F410 Panic disorder [episodic paroxysmal anxiety] without agoraphobia: Secondary | ICD-10-CM

## 2013-08-23 DIAGNOSIS — F17200 Nicotine dependence, unspecified, uncomplicated: Secondary | ICD-10-CM

## 2013-08-23 DIAGNOSIS — I10 Essential (primary) hypertension: Secondary | ICD-10-CM

## 2013-08-23 MED ORDER — ALPRAZOLAM 0.5 MG PO TABS: 0.2500 mg | ORAL_TABLET | Freq: Three times a day (TID) | ORAL | Status: DC

## 2013-08-23 MED ORDER — LISINOPRIL-HYDROCHLOROTHIAZIDE 20-25 MG PO TABS: 0.5000 | ORAL_TABLET | Freq: Every morning | ORAL | Status: DC

## 2013-08-23 MED ORDER — OMEPRAZOLE 20 MG PO CPDR: 40.0000 mg | DELAYED_RELEASE_CAPSULE | Freq: Every day | ORAL | Status: AC

## 2013-08-23 NOTE — Progress Notes (Unsigned)
Patient Demographics  [name], is a 47 y.o. female  ZOX:[id number]  WJX:[id number]  DOB - 1966/03/27  Chief Complaint  Patient presents with  . Establish Care        Subjective:   [name] today is here for a follow up visit.  Patient has No headache, No chest pain, No abdominal pain - No Nausea, No new weakness tingling or numbness, No Cough - SOB. ********  Objective:    Filed Vitals:   08/23/13 1006  BP: 112/73  Pulse: 62  Temp: 98.2 F (36.8 C)  TempSrc: Oral  Resp: 16  Height: 5\' 8"  (1.727 m)  Weight: 242 lb (109.77 kg)  SpO2: 97%     ALLERGIES:   Allergies  Allergen Reactions  . Benadryl [Diphenhydramine]     Throat closes up    PAST MEDICAL HISTORY: Past Medical History  Diagnosis Date  . Hypertension   . Panic disorder   . Depression   . Arthritis   . GERD (gastroesophageal reflux disease)   . Gallstones   . Dysrhythmia     rare pvc     MEDICATIONS AT HOME: Prior to Admission medications   Medication Sig Start Date End Date Taking? Authorizing Provider  ALPRAZolam Prudy Feeler) 0.5 MG tablet Take 0.5 tablets (0.25 mg total) by mouth 3 (three) times daily. For anxiety 08/23/13  Yes Raseel Jans Levora Dredge, MD  atenolol (TENORMIN) 50 MG tablet Take 50 mg by mouth every morning.    Yes Historical Provider, MD  citalopram (CELEXA) 20 MG tablet Take 20 mg by mouth every morning.   Yes Historical Provider, MD  ibuprofen (ADVIL,MOTRIN) 200 MG tablet Take 400-600 mg by mouth every 6 (six) hours as needed for pain.   Yes Historical Provider, MD  lisinopril-hydrochlorothiazide (PRINZIDE,ZESTORETIC) 20-25 MG per tablet Take 0.5 tablets by mouth every morning. 08/23/13  Yes Elijah Phommachanh Levora Dredge, MD  omeprazole (PRILOSEC) 20 MG capsule Take 2 capsules (40 mg total) by mouth daily. 08/23/13   Dianne Whelchel Levora Dredge, MD     Exam  General appearance :Awake, alert, not in any distress. Speech Clear. Not toxic Looking HEENT: Atraumatic and Normocephalic, pupils equally  reactive to light and accomodation Neck: supple, no JVD. No cervical lymphadenopathy.  Chest:Good air entry bilaterally, no added sounds  CVS: S1 S2 regular, no murmurs.  Abdomen: Bowel sounds present, Non tender and not distended with no gaurding, rigidity or rebound. Extremities: B/L Lower Ext shows no edema, both legs are warm to touch Neurology: Awake alert, and oriented X 3, CN II-XII intact, Non focal Skin:No Rash Wounds:N/A    Data Review   CBC No results found for this basename: WBC, HGB, HCT, PLT, MCV, MCH, MCHC, RDW, NEUTRABS, LYMPHSABS, MONOABS, EOSABS, BASOSABS, BANDABS, BANDSABD,  in the last 168 hours  Chemistries   No results found for this basename: NA, K, CL, CO2, GLUCOSE, BUN, CREATININE, GFRCGP, CALCIUM, MG, AST, ALT, ALKPHOS, BILITOT,  in the last 168 hours ------------------------------------------------------------------------------------------------------------------ No results found for this basename: HGBA1C,  in the last 72 hours ------------------------------------------------------------------------------------------------------------------ No results found for this basename: CHOL, HDL, LDLCALC, TRIG, CHOLHDL, LDLDIRECT,  in the last 72 hours ------------------------------------------------------------------------------------------------------------------ No results found for this basename: TSH, T4TOTAL, FREET3, T3FREE, THYROIDAB,  in the last 72 hours ------------------------------------------------------------------------------------------------------------------ No results found for this basename: VITAMINB12, FOLATE, FERRITIN, TIBC, IRON, RETICCTPCT,  in the last 72 hours  Coagulation profile  No results found for this basename: INR, PROTIME,  in the last 168 hours    Assessment &  Plan  HTN -  continue with lisinopril-HCTZ and atenolol - Could control  Gastroesophageal reflux disease - PPI  Anxiety - Continue with Xanax and Celexa  Bilateral  knee pain - Osteoarthritis - Will defer to the sports medicine clinic  Tobacco use - Does not have the desire to quit - She knows the side effects/harmful effects  Health Maintenance -Colonoscopy:due when she turns 48 -Pap Smear:refer to [facility] -Mammogram:will order -Vaccinations:  -Influenza-order today  Follow up in 1 week-will ask she get labs prior to the visit   The patient was given clear instructions to go to ER or return to medical center if symptoms don't improve, worsen or new problems develop. The patient verbalized understanding. The patient was told to call to get lab results if they haven't heard anything in the next week.

## 2013-08-23 NOTE — Progress Notes (Unsigned)
Pt is here to establish care. Pt reports that she is having bilateral leg pain, pain scale 8, sharp, aches and a stabbing pain for about 3-4 months. Also requesting medication refill.

## 2013-08-24 ENCOUNTER — Ambulatory Visit: Payer: Self-pay

## 2013-08-25 ENCOUNTER — Ambulatory Visit: Payer: No Typology Code available for payment source | Attending: Internal Medicine

## 2013-08-25 DIAGNOSIS — F17200 Nicotine dependence, unspecified, uncomplicated: Secondary | ICD-10-CM

## 2013-08-25 DIAGNOSIS — K219 Gastro-esophageal reflux disease without esophagitis: Secondary | ICD-10-CM

## 2013-08-25 DIAGNOSIS — I10 Essential (primary) hypertension: Secondary | ICD-10-CM

## 2013-08-25 DIAGNOSIS — F410 Panic disorder [episodic paroxysmal anxiety] without agoraphobia: Secondary | ICD-10-CM

## 2013-08-25 LAB — HEMOGLOBIN A1C
Hgb A1c MFr Bld: 6 % — ABNORMAL HIGH (ref <5.7)
Mean Plasma Glucose: 126 mg/dL — ABNORMAL HIGH (ref <117)

## 2013-08-25 LAB — CBC
HCT: 45.7 % (ref 36.0–46.0)
Hemoglobin: 16.3 g/dL — ABNORMAL HIGH (ref 12.0–15.0)
MCHC: 35.7 g/dL (ref 30.0–36.0)
RDW: 14 % (ref 11.5–15.5)
WBC: 5.2 10*3/uL (ref 4.0–10.5)

## 2013-08-25 LAB — LIPID PANEL
LDL Cholesterol: 141 mg/dL — ABNORMAL HIGH (ref 0–99)
Triglycerides: 236 mg/dL — ABNORMAL HIGH (ref <150)
VLDL: 47 mg/dL — ABNORMAL HIGH (ref 0–40)

## 2013-08-25 NOTE — Progress Notes (Unsigned)
PT HERE FOR BLOOD WORK. VSS

## 2013-08-29 ENCOUNTER — Telehealth: Payer: Self-pay | Admitting: Emergency Medicine

## 2013-08-29 NOTE — Telephone Encounter (Signed)
Message copied by Darlis Loan on Tue Aug 29, 2013 11:41 AM ------      Message from: Maretta Bees      Created: Tue Aug 29, 2013  9:30 AM       Patient needs a follow up visit in the next 1-2 weeks, to go over lab results ------

## 2013-08-29 NOTE — Telephone Encounter (Signed)
PT ALREADY SCHEDULED 09/12/13  TO DISCUSS LAB RESULTS

## 2013-09-06 ENCOUNTER — Ambulatory Visit (HOSPITAL_COMMUNITY)
Admission: RE | Admit: 2013-09-06 | Discharge: 2013-09-06 | Disposition: A | Discharge status: Home / Self Care | Payer: No Typology Code available for payment source | Source: Ambulatory Visit | Attending: Internal Medicine | Admitting: Internal Medicine

## 2013-09-06 DIAGNOSIS — F410 Panic disorder [episodic paroxysmal anxiety] without agoraphobia: Secondary | ICD-10-CM

## 2013-09-06 DIAGNOSIS — M899 Disorder of bone, unspecified: Secondary | ICD-10-CM | POA: Insufficient documentation

## 2013-09-06 DIAGNOSIS — M25569 Pain in unspecified knee: Secondary | ICD-10-CM | POA: Insufficient documentation

## 2013-09-06 DIAGNOSIS — K219 Gastro-esophageal reflux disease without esophagitis: Secondary | ICD-10-CM

## 2013-09-06 DIAGNOSIS — F17200 Nicotine dependence, unspecified, uncomplicated: Secondary | ICD-10-CM

## 2013-09-06 DIAGNOSIS — I10 Essential (primary) hypertension: Secondary | ICD-10-CM

## 2013-09-08 ENCOUNTER — Encounter: Payer: Self-pay | Admitting: Family Medicine

## 2013-09-08 ENCOUNTER — Ambulatory Visit (INDEPENDENT_AMBULATORY_CARE_PROVIDER_SITE_OTHER): Payer: No Typology Code available for payment source | Admitting: Family Medicine

## 2013-09-08 VITALS — BP 137/86 | HR 79 | Ht 68.0 in | Wt 242.0 lb

## 2013-09-08 DIAGNOSIS — M170 Bilateral primary osteoarthritis of knee: Secondary | ICD-10-CM

## 2013-09-08 MED ORDER — METHYLPREDNISOLONE ACETATE 40 MG/ML IJ SUSP
40.0000 mg | Freq: Once | INTRAMUSCULAR | Status: AC
  Administered 2013-09-08: 40 mg via INTRA_ARTICULAR

## 2013-09-08 NOTE — Progress Notes (Signed)
Patient ID: [name], female   DOB: [birth year], 47 y.o.   MRN: [id number] This is a 47 year old female who presents with several months of bilateral knee pain. Right knee is worse than the left. Reports a stiffness when getting up out of bed. Pain with prolonged standing and movement. No radiation of the pain. First noted the pain back in April or May it has progressively worsened and increased. She has not taken anything to relieve the pain. No recent injuries. 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  Past Medical History  Diagnosis Date  . Hypertension   . Panic disorder   . Depression   . Arthritis   . GERD (gastroesophageal reflux disease)   . Gallstones   . Dysrhythmia     rare pvc    Past Surgical History  Procedure Laterality Date  . Abdominal hysterectomy    . Ercp N/A 03/03/2013    Procedure: ENDOSCOPIC RETROGRADE CHOLANGIOPANCREATOGRAPHY (ERCP);  Surgeon: Theda Belfast, MD;  Location: [facility];  Service: Endoscopy;  Laterality: N/A;  . Eus N/A 03/03/2013    Procedure: UPPER ENDOSCOPIC ULTRASOUND (EUS) LINEAR;  Surgeon: Theda Belfast, MD;  Location: [facility];  Service: Endoscopy;  Laterality: N/A;  . Cholecystectomy N/A 03/14/2013    Procedure: LAPAROSCOPIC CHOLECYSTECTOMY WITH INTRAOPERATIVE CHOLANGIOGRAM;  Surgeon: Velora Heckler, MD;  Location: [facility];  Service: General;  Laterality: N/A;   Family history significant for osteoarthritis.  Review of systems as per history of present illness otherwise negative.  Examination:  BP 137/86  Pulse 79  Ht 5\' 8"  (1.727 m)  Wt 242 lb (109.77 kg)  BMI 36.8 kg/m2 This is a well-developed well-nourished 47 year old female awake alert and oriented in no acute distress.  Bilateral Knee examination: Normal to inspection with no erythema or effusion or obvious bony abnormalities. Palpation with no warmth, mild joint line tenderness right greater than left. ROM normal in flexion and extension and lower  leg rotation. Ligaments with solid consistent endpoints including ACL, PCL, LCL, MCL. Negative Mcmurray's and provocative meniscal tests. Non painful patellar compression, however crepitus is noted with patellar glide. Patellar and quadriceps tendons unremarkable. Hamstring and quadriceps strength is normal.  Neurovascularly intact bilateral lower extremities  Procedure:  Injection of right and left knees Consent obtained and verified. Noted no overlying erythema, induration, or other signs of local infection. Skin prepped in a sterile fashion. Topical analgesic spray: Ethyl chloride. Completed without difficulty. Meds: 4:1 xylocaine:depomedrol Pain immediately improved suggesting accurate placement of the medication. Patient tolerated well Advised to call if fevers/chills, erythema, induration, drainage, or persistent bleeding.  Prior X-rays were reviewed today showing evidence of bilateral OA, right worse than left.

## 2013-09-08 NOTE — Assessment & Plan Note (Signed)
4:1 corticosteroid injection done in each knee today in the office. She will followup as needed.

## 2013-09-12 ENCOUNTER — Ambulatory Visit: Payer: No Typology Code available for payment source | Attending: Internal Medicine | Admitting: Internal Medicine

## 2013-09-12 ENCOUNTER — Encounter: Payer: Self-pay | Admitting: Internal Medicine

## 2013-09-12 VITALS — BP 134/82 | HR 69 | Temp 98.2°F | Resp 16 | Ht 68.0 in | Wt 241.0 lb

## 2013-09-12 DIAGNOSIS — R7309 Other abnormal glucose: Secondary | ICD-10-CM | POA: Insufficient documentation

## 2013-09-12 DIAGNOSIS — F410 Panic disorder [episodic paroxysmal anxiety] without agoraphobia: Secondary | ICD-10-CM

## 2013-09-12 DIAGNOSIS — I10 Essential (primary) hypertension: Secondary | ICD-10-CM

## 2013-09-12 DIAGNOSIS — E785 Hyperlipidemia, unspecified: Secondary | ICD-10-CM

## 2013-09-12 DIAGNOSIS — F17200 Nicotine dependence, unspecified, uncomplicated: Secondary | ICD-10-CM

## 2013-09-12 DIAGNOSIS — R7303 Prediabetes: Secondary | ICD-10-CM | POA: Insufficient documentation

## 2013-09-12 MED ORDER — ALPRAZOLAM 1 MG PO TABS: 0.5000 mg | ORAL_TABLET | Freq: Three times a day (TID) | ORAL | Status: DC | PRN

## 2013-09-12 NOTE — Progress Notes (Signed)
Pt is here for a F/U visit Pt is here to review her lab results  

## 2013-09-12 NOTE — Patient Instructions (Signed)
Hypercholesterolemia High Blood Cholesterol Cholesterol is a white, waxy, fat-like protein needed by your body in small amounts. The liver makes all the cholesterol you need. It is carried from the liver by the blood through the blood vessels. Deposits (plaque) may build up on blood vessel walls. This makes the arteries narrower and stiffer. Plaque increases the risk for heart attack and stroke. You cannot feel your cholesterol level even if it is very high. The only way to know is by a blood test to check your lipid (fats) levels. Once you know your cholesterol levels, you should keep a record of the test results. Work with your caregiver to to keep your levels in the desired range. WHAT THE RESULTS MEAN:  Total cholesterol is a rough measure of all the cholesterol in your blood.  LDL is the so-called bad cholesterol. This is the type that deposits cholesterol in the walls of the arteries. You want this level to be low.  HDL is the good cholesterol because it cleans the arteries and carries the LDL away. You want this level to be high.  Triglycerides are fat that the body can either burn for energy or store. High levels are closely linked to heart disease. DESIRED LEVELS:  Total cholesterol below 200.  LDL below 100 for people at risk, below 70 for very high risk.  HDL above 50 is good, above 60 is best.  Triglycerides below 150. HOW TO LOWER YOUR CHOLESTEROL:  Diet.  Choose fish or white meat chicken and [place], roasted or baked. Limit fatty cuts of red meat, fried foods, and processed meats, such as sausage and lunch meat.  Eat lots of fresh fruits and vegetables. Choose whole grains, beans, pasta, potatoes and cereals.  Use only small amounts of olive, corn or canola oils. Avoid butter, mayonnaise, shortening or palm kernel oils. Avoid foods with trans-fats.  Use skim/nonfat milk and low-fat/nonfat yogurt and cheeses. Avoid whole milk, cream, ice cream, egg yolks and cheeses.  Healthy desserts include angel food cake, gingersnaps, animal crackers, hard candy, popsicles, and low-fat/nonfat frozen yogurt. Avoid pastries, cakes, pies and cookies.  Exercise.  A regular program helps decrease LDL and raises HDL.  Helps with weight control.  Do things that increase your activity level like gardening, walking, or taking the stairs.  Medication.  May be prescribed by your caregiver to help lowering cholesterol and the risk for heart disease.  You may need medicine even if your levels are normal if you have several risk factors. HOME CARE INSTRUCTIONS   Follow your diet and exercise programs as suggested by your caregiver.  Take medications as directed.  Have blood work done when your caregiver feels it is necessary. MAKE SURE YOU:   Understand these instructions.  Will watch your condition.  Will get help right away if you are not doing well or get worse. Document Released: 11/23/2005 Document Revised: 02/15/2012 Document Reviewed: 05/11/2007 [facility] Patient Information 2014 [place], [place]. Hyperglycemia Hyperglycemia occurs when the glucose (sugar) in your blood is too high. Hyperglycemia can happen for many reasons, but it most often happens to people who do not know they have diabetes or are not managing their diabetes properly.  CAUSES  Whether you have diabetes or not, there are other causes of hyperglycemia. Hyperglycemia can occur when you have diabetes, but it can also occur in other situations that you might not be as aware of, such as: Diabetes  If you have diabetes and are having problems controlling your blood glucose,  hyperglycemia could occur because of some of the following reasons:  Not following your meal plan.  Not taking your diabetes medications or not taking it properly.  Exercising less or doing less activity than you normally do.  Being sick. Pre-diabetes  This cannot be ignored. Before people develop Type 2 diabetes, they  almost always have "pre-diabetes." This is when your blood glucose levels are higher than normal, but not yet high enough to be diagnosed as diabetes. Research has shown that some long-term damage to the body, especially the heart and circulatory system, may already be occurring during pre-diabetes. If you take action to manage your blood glucose when you have pre-diabetes, you may delay or prevent Type 2 diabetes from developing. Stress  If you have diabetes, you may be "diet" controlled or on oral medications or insulin to control your diabetes. However, you may find that your blood glucose is higher than usual in the hospital whether you have diabetes or not. This is often referred to as "stress hyperglycemia." Stress can elevate your blood glucose. This happens because of hormones put out by the body during times of stress. If stress has been the cause of your high blood glucose, it can be followed regularly by your caregiver. That way he/she can make sure your hyperglycemia does not continue to get worse or progress to diabetes. Steroids  Steroids are medications that act on the infection fighting system (immune system) to block inflammation or infection. One side effect can be a rise in blood glucose. Most people can produce enough extra insulin to allow for this rise, but for those who cannot, steroids make blood glucose levels go even higher. It is not unusual for steroid treatments to "uncover" diabetes that is developing. It is not always possible to determine if the hyperglycemia will go away after the steroids are stopped. A special blood test called an A1c is sometimes done to determine if your blood glucose was elevated before the steroids were started. SYMPTOMS  Thirsty.  Frequent urination.  Dry mouth.  Blurred vision.  Tired or fatigue.  Weakness.  Sleepy.  Tingling in feet or leg. DIAGNOSIS  Diagnosis is made by monitoring blood glucose in one or all of the following  ways:  A1c test. This is a chemical found in your blood.  Fingerstick blood glucose monitoring.  Laboratory results. TREATMENT  First, knowing the cause of the hyperglycemia is important before the hyperglycemia can be treated. Treatment may include, but is not be limited to:  Education.  Change or adjustment in medications.  Change or adjustment in meal plan.  Treatment for an illness, infection, etc.  More frequent blood glucose monitoring.  Change in exercise plan.  Decreasing or stopping steroids.  Lifestyle changes. HOME CARE INSTRUCTIONS   Test your blood glucose as directed.  Exercise regularly. Your caregiver will give you instructions about exercise. Pre-diabetes or diabetes which comes on with stress is helped by exercising.  Eat wholesome, balanced meals. Eat often and at regular, fixed times. Your caregiver or nutritionist will give you a meal plan to guide your sugar intake.  Being at an ideal weight is important. If needed, losing as little as 10 to 15 pounds may help improve blood glucose levels. SEEK MEDICAL CARE IF:   You have questions about medicine, activity, or diet.  You continue to have symptoms (problems such as increased thirst, urination, or weight gain). SEEK IMMEDIATE MEDICAL CARE IF:   You are vomiting or have diarrhea.  Your breath  smells fruity.  You are breathing faster or slower.  You are very sleepy or incoherent.  You have numbness, tingling, or pain in your feet or hands.  You have chest pain.  Your symptoms get worse even though you have been following your caregiver's orders.  If you have any other questions or concerns. Document Released: 05/19/2001 Document Revised: 02/15/2012 Document Reviewed: 03/21/2012 [facility] Patient Information 2014 [place], [place].

## 2013-09-12 NOTE — Progress Notes (Signed)
Patient ID: [name], female   DOB: September 13, 1966, 47 y.o.   MRN: [id number] Patient Demographics  [name], is a 47 y.o. female  [id number]  [id number]  DOB - 08-Nov-1966  Chief Complaint  Patient presents with  . Follow-up        Subjective:   [name] is a 47 y.o. female here today for a follow up visit. She would like to discuss the results of her previous laboratory tests. She has no complaint or concern today. [facility] to both knees. Patient has No headache, No chest pain, No abdominal pain - No Nausea, No new weakness tingling or numbness, No Cough - SOB.  ALLERGIES: Allergies  Allergen Reactions  . Benadryl [Diphenhydramine]     Throat closes up    PAST MEDICAL HISTORY: Past Medical History  Diagnosis Date  . Hypertension   . Panic disorder   . Depression   . Arthritis   . GERD (gastroesophageal reflux disease)   . Gallstones   . Dysrhythmia     rare pvc     MEDICATIONS AT HOME: Prior to Admission medications   Medication Sig Start Date End Date Taking? Authorizing Provider  ALPRAZolam Prudy Feeler) 1 MG tablet Take 0.5 tablets (0.5 mg total) by mouth 3 (three) times daily as needed for sleep. For anxiety 09/12/13  Yes Jeanann Lewandowsky, MD  atenolol (TENORMIN) 50 MG tablet Take 50 mg by mouth every morning.    Yes Historical Provider, MD  citalopram (CELEXA) 20 MG tablet Take 20 mg by mouth every morning.   Yes Historical Provider, MD  ibuprofen (ADVIL,MOTRIN) 200 MG tablet Take 400-600 mg by mouth every 6 (six) hours as needed for pain.   Yes Historical Provider, MD  lisinopril-hydrochlorothiazide (PRINZIDE,ZESTORETIC) 20-25 MG per tablet Take 0.5 tablets by mouth every morning. 08/23/13  Yes Shanker Levora Dredge, MD  omeprazole (PRILOSEC) 20 MG capsule Take 2 capsules (40 mg total) by mouth daily. 08/23/13  Yes Shanker Levora Dredge, MD     Objective:   Filed Vitals:   09/12/13 0932  BP: 134/82   Pulse: 69  Temp: 98.2 F (36.8 C)  TempSrc: Oral  Resp: 16  Height: 5\' 8"  (1.727 m)  Weight: 241 lb (109.317 kg)  SpO2: 97%    Exam General appearance : Awake, alert, not in any distress. Speech Clear. Not toxic looking HEENT: Atraumatic and Normocephalic, pupils equally reactive to light and accomodation Neck: supple, no JVD. No cervical lymphadenopathy.  Chest:Good air entry bilaterally, no added sounds  CVS: S1 S2 regular, no murmurs.  Abdomen: Bowel sounds present, Non tender and not distended with no gaurding, rigidity or rebound. Extremities: B/L Lower Ext shows no edema, both legs are warm to touch Neurology: Awake alert, and oriented X 3, CN II-XII intact, Non focal Skin:No Rash Wounds:N/A   Data Review   CBC No results found for this basename: WBC, HGB, HCT, PLT, MCV, MCH, MCHC, RDW, NEUTRABS, LYMPHSABS, MONOABS, EOSABS, BASOSABS, BANDABS, BANDSABD,  in the last 168 hours  Chemistries   No results found for this basename: NA, K, CL, CO2, GLUCOSE, BUN, CREATININE, GFRCGP, CALCIUM, MG, AST, ALT, ALKPHOS, BILITOT,  in the last 168 hours ------------------------------------------------------------------------------------------------------------------ No results found for this basename: HGBA1C,  in the last 72 hours ------------------------------------------------------------------------------------------------------------------ No results found for this basename: CHOL, HDL, LDLCALC, TRIG, CHOLHDL, LDLDIRECT,  in the last 72 hours ------------------------------------------------------------------------------------------------------------------ No results found for this basename: TSH, T4TOTAL, FREET3, T3FREE, THYROIDAB,  in the last 72 hours ------------------------------------------------------------------------------------------------------------------ No results found for this basename: VITAMINB12, FOLATE, FERRITIN, TIBC, IRON, RETICCTPCT,  in the last 72  hours  Coagulation profile  No results found for this basename: INR, PROTIME,  in the last 168 hours    Assessment & Plan   Patient Active Problem List   Diagnosis Date Noted  . Prediabetes 09/12/2013  . Dyslipidemia (high LDL; low HDL) 09/12/2013  . Arthritis of both knees 09/08/2013  . Cholelithiasis with cholecystitis 03/13/2013  . Choledocholithiasis, status post ERCP 03/13/2013  . CERUMEN IMPACTION, RIGHT 10/28/2010  . LUMBAGO 07/31/2010  . GANGLION CYST, WRIST, RIGHT 07/31/2010  . CANDIDIASIS, VAGINAL 02/07/2009  . TMJ SYNDROME 02/07/2009  . PREMATURE VENTRICULAR CONTRACTIONS 12/10/2008  . PANIC DISORDER 10/17/2008  . TOBACCO ABUSE 10/17/2008  . HYPERLIPIDEMIA 07/17/2008  . GERD 03/19/2008  . BREAST MASS, RIGHT 12/07/2006  . HYPERTENSION, BENIGN ESSENTIAL 12/07/2005     Plan: Results of previous laboratory tests discussed with patient She was encouraged to begin a regular graded exercise in an attempt to lower blood cholesterol, as well as nutritional control, she has been provided with resources for the acceptable diet Hemoglobin A1c 6.0, significance of this discussed the patient. She has also been counseled on nutrition and exercise for control. We will repeat blood tests in 3-6 months  Patient has been counseled extensively about smoking cessation  Refill of Xanax given  Health Maintenance -Vaccinations:  -Influenza  Follow up in 3 months or when necessary  The patient was given clear instructions to go to ER or return to medical center if symptoms don't improve, worsen or new problems develop. The patient verbalized understanding. The patient was told to call to get lab results if they haven't heard anything in the next week.    Jeanann Lewandowsky, MD, MHA, FACP, FAAP [facility] [place], [place] [phone]   09/12/2013, 9:53 AM

## 2013-09-13 ENCOUNTER — Encounter: Payer: Self-pay | Admitting: Obstetrics and Gynecology

## 2013-10-01 IMAGING — RF DG CHOLANGIOGRAM OPERATIVE
1 series · 3 of 3 positions shown · non-contrast
Comparison: None

CLINICAL DATA: Cholelithiasis

INTRAOPERATIVE CHOLANGIOGRAM
TECHNIQUE: Cholangiographic images from the C-arm fluoroscopic
device were submitted for interpretation post-operatively.  Please
see the procedural report for the amount of contrast and the
fluoroscopy time utilized.

[Series 1: run · 3 of 135 frames shown]
[frame 21/135]
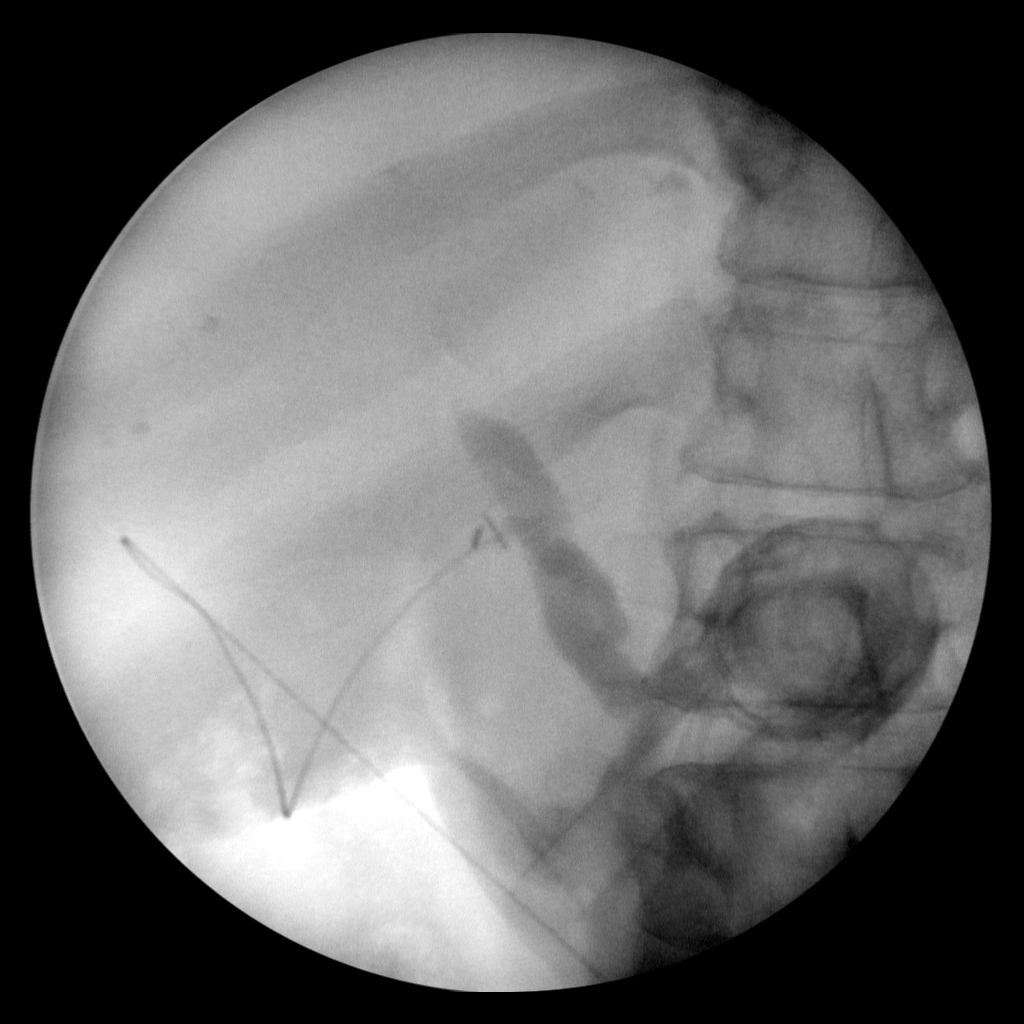
[frame 68/135]
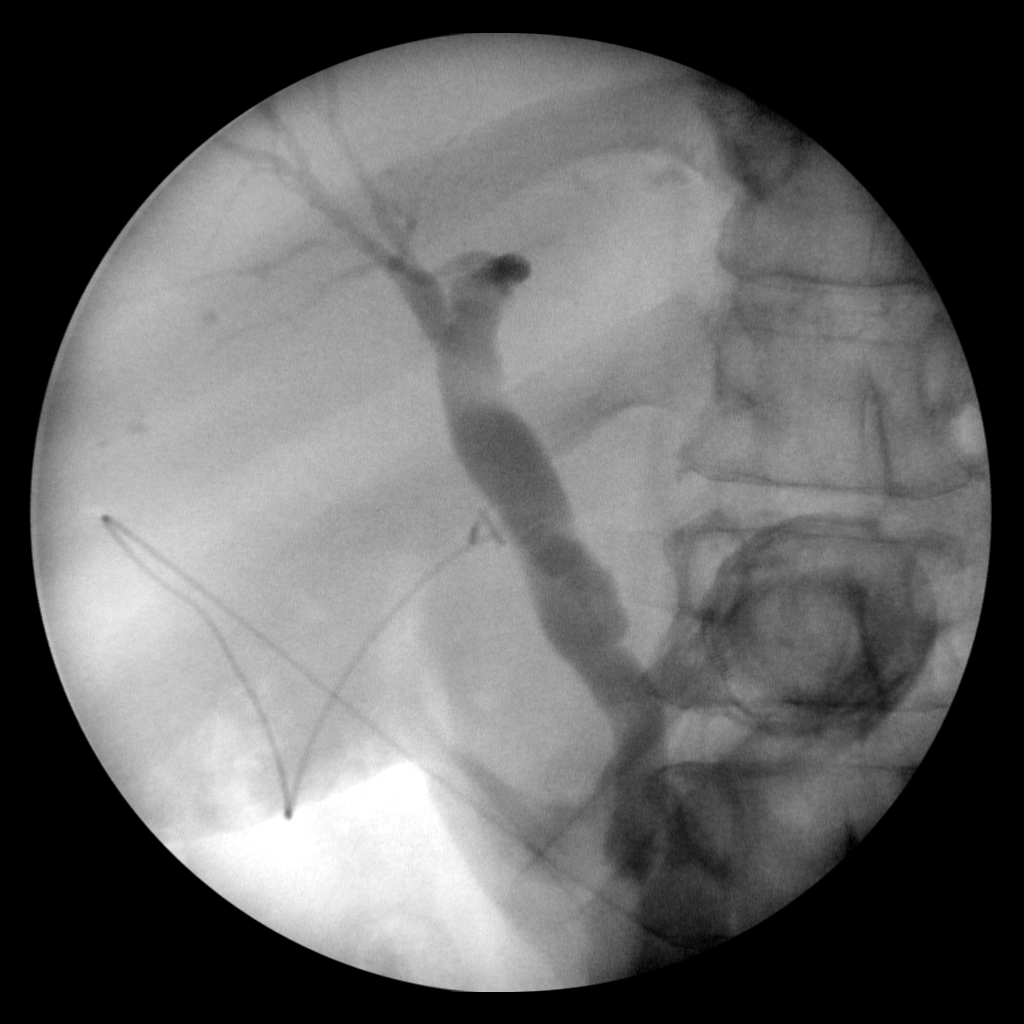
[frame 115/135]
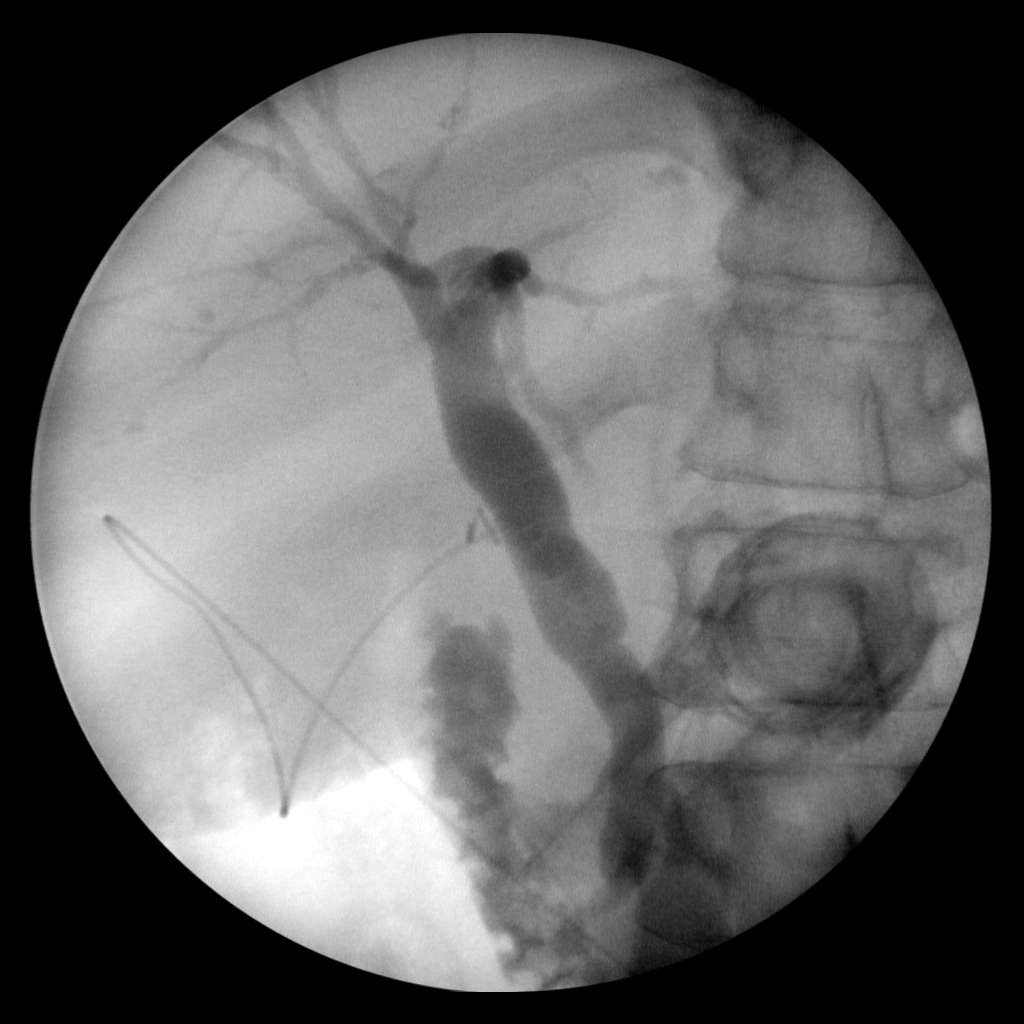

[3 of 3 positions shown; findings below may reference images not displayed]

FINDINGS: No persistent filling defects in the common duct.
Intrahepatic ducts are incompletely visualized, appearing
decompressed centrally. Contrast passes into the duodenum.

IMPRESSION

Negative for retained common duct stone.

## 2013-11-01 ENCOUNTER — Encounter: Payer: No Typology Code available for payment source | Admitting: Obstetrics and Gynecology

## 2013-12-13 ENCOUNTER — Ambulatory Visit: Payer: No Typology Code available for payment source | Admitting: Internal Medicine

## 2014-03-26 IMAGING — CR DG KNEE COMPLETE 4+V*R*
4 series · 4 of 4 positions shown · non-contrast
Comparison: 08/24/2012

CLINICAL DATA: Knee pain

EXAM:
RIGHT KNEE - COMPLETE 4+ VIEW

[t knee ap right]
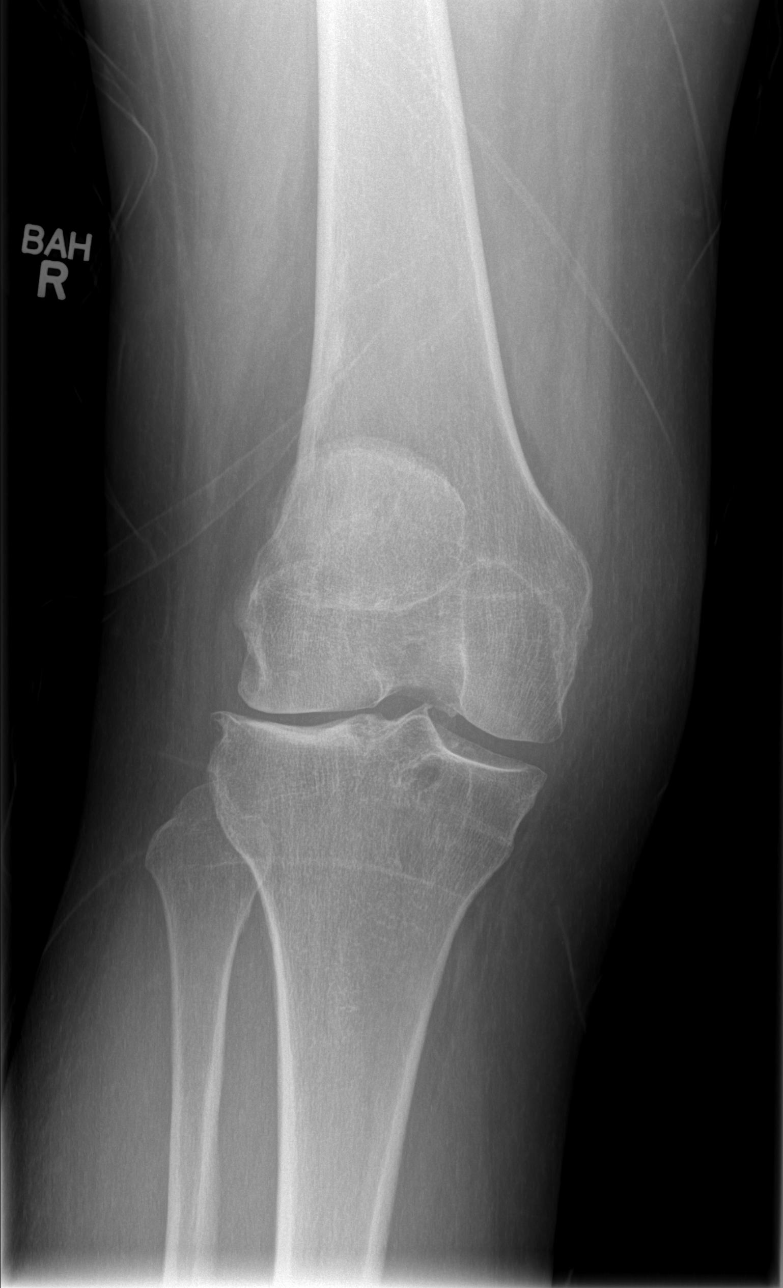

[t knee oblique right (1 of 2)]
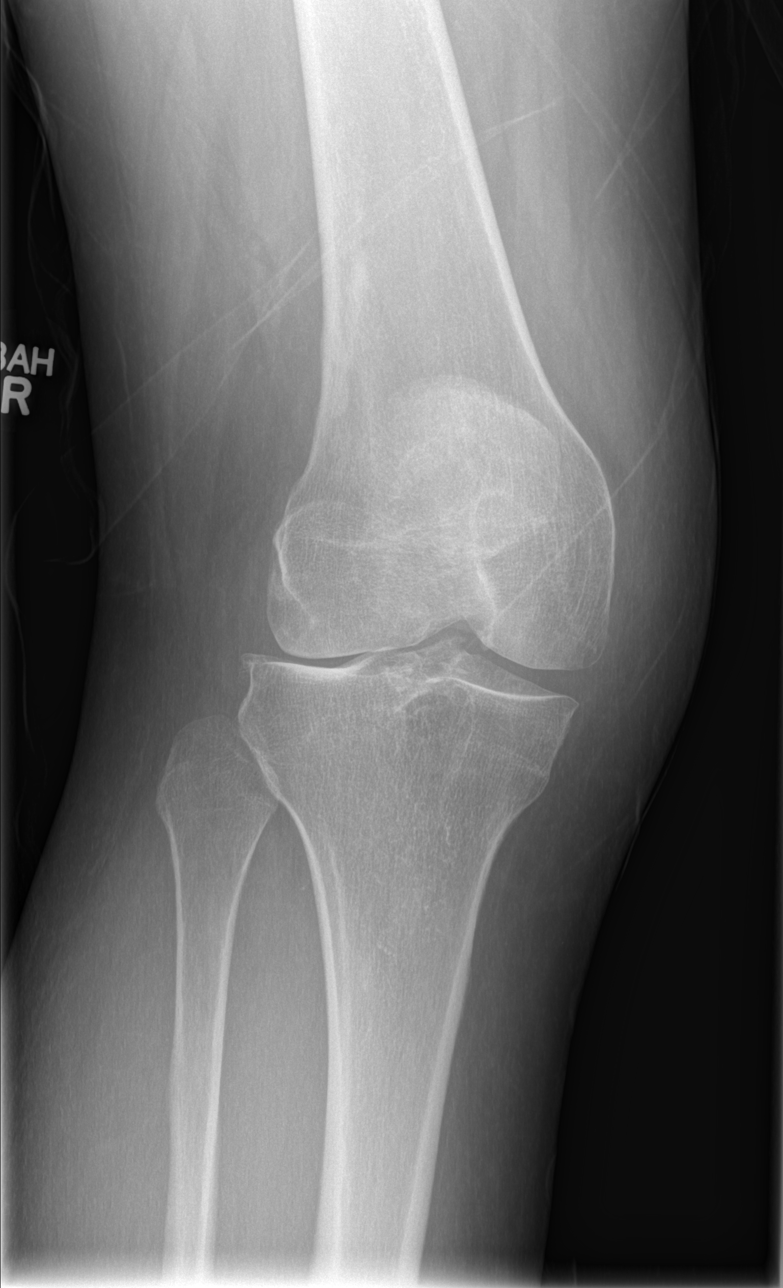

[t knee oblique right (2 of 2)]
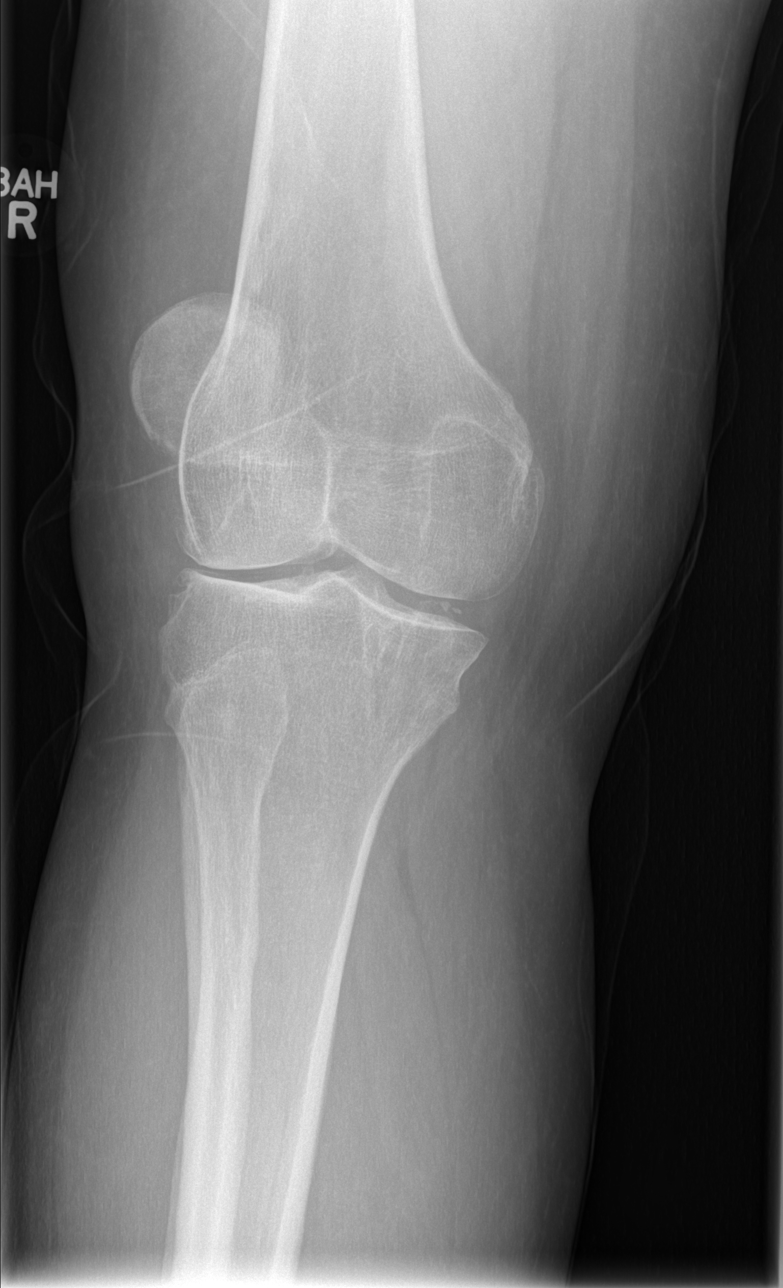

[t knee lat right]
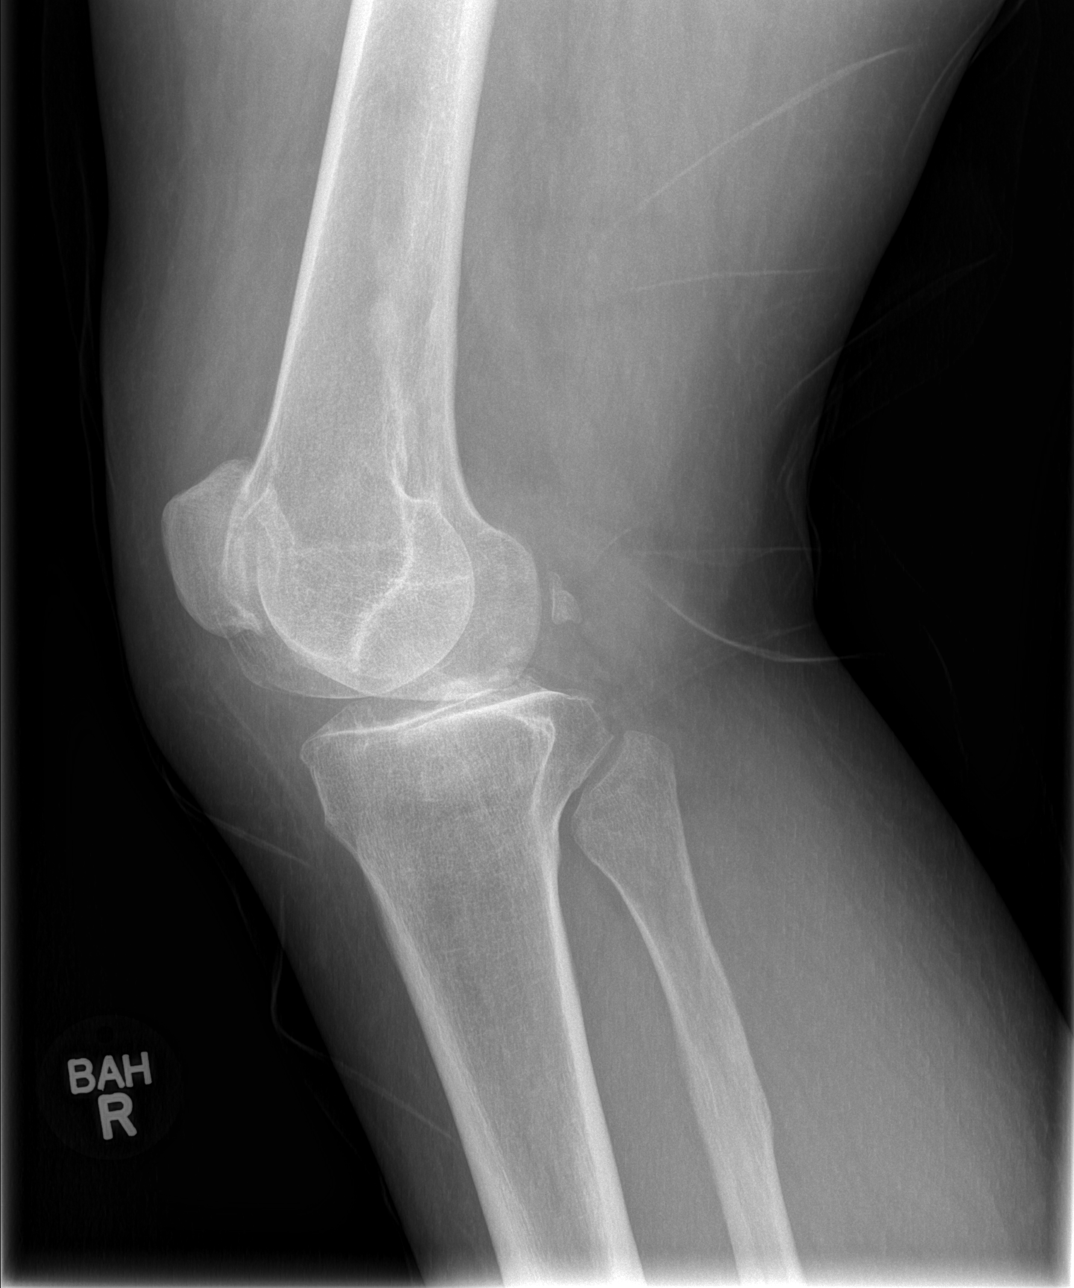

[4 of 4 positions shown; findings below may reference images not displayed]

FINDINGS: Lateral joint space narrowing with mild associated spurring.
Calcification within the medial meniscus may represent loose bodies
in the joint. These were present previously. Lucency in the proximal
tibia is unchanged and most likely degenerative in nature. There is
degenerative spurring in the patella with a small joint effusion.

Sclerotic lesion in the posterior cortex of the distal femur is
unchanged and most likely a healed fibrous cortical defect.

Negative for acute fracture.
IMPRESSION: Moderate degenerative changes. No acute abnormality and no change
from 08/24/2012.

## 2015-03-22 ENCOUNTER — Encounter (HOSPITAL_BASED_OUTPATIENT_CLINIC_OR_DEPARTMENT_OTHER): Payer: Self-pay | Admitting: Emergency Medicine

## 2015-03-22 ENCOUNTER — Emergency Department (HOSPITAL_BASED_OUTPATIENT_CLINIC_OR_DEPARTMENT_OTHER)
Admission: EM | Admit: 2015-03-22 | Discharge: 2015-03-23 | Disposition: A | Discharge status: Home / Self Care | Payer: No Typology Code available for payment source | Attending: Emergency Medicine | Admitting: Emergency Medicine

## 2015-03-22 DIAGNOSIS — I10 Essential (primary) hypertension: Secondary | ICD-10-CM | POA: Diagnosis not present

## 2015-03-22 DIAGNOSIS — I499 Cardiac arrhythmia, unspecified: Secondary | ICD-10-CM | POA: Diagnosis not present

## 2015-03-22 DIAGNOSIS — K219 Gastro-esophageal reflux disease without esophagitis: Secondary | ICD-10-CM | POA: Diagnosis not present

## 2015-03-22 DIAGNOSIS — Z720 Tobacco use: Secondary | ICD-10-CM | POA: Insufficient documentation

## 2015-03-22 DIAGNOSIS — M1711 Unilateral primary osteoarthritis, right knee: Secondary | ICD-10-CM | POA: Diagnosis not present

## 2015-03-22 DIAGNOSIS — M25561 Pain in right knee: Secondary | ICD-10-CM | POA: Diagnosis present

## 2015-03-22 DIAGNOSIS — M1990 Unspecified osteoarthritis, unspecified site: Secondary | ICD-10-CM | POA: Insufficient documentation

## 2015-03-22 DIAGNOSIS — Z79899 Other long term (current) drug therapy: Secondary | ICD-10-CM | POA: Insufficient documentation

## 2015-03-22 DIAGNOSIS — F410 Panic disorder [episodic paroxysmal anxiety] without agoraphobia: Secondary | ICD-10-CM | POA: Insufficient documentation

## 2015-03-22 DIAGNOSIS — F329 Major depressive disorder, single episode, unspecified: Secondary | ICD-10-CM | POA: Diagnosis not present

## 2015-03-22 NOTE — ED Notes (Signed)
Patient reports pain to right knee.  Reports this began today.  Reports she has arthritis in her knee and has received cortisone shots in the past for this.  Swelling noted to right knee. Patient reports "bump" behind left knee which is new.

## 2015-03-22 NOTE — ED Provider Notes (Signed)
CSN: [id number]     Arrival date & time 03/22/15  2218 History  This chart was scribed for Derwood Kaplan, MD by Roxy Cedar, ED Scribe. This patient was seen in room MH09/MH09 and the patient's care was started at 12:12 AM.   Chief Complaint  Patient presents with  . Knee Pain   Patient is a 49 y.o. female presenting with knee pain. The history is provided by the patient. No language interpreter was used.  Knee Pain Location:  Knee Injury: no   Knee location:  R knee Pain details:    Quality:  Aching   Radiates to:  Does not radiate   Severity:  Moderate   Timing:  Constant   Progression:  Waxing and waning Chronicity:  New Dislocation: no   Foreign body present:  No foreign bodies Associated symptoms: no fever    HPI Comments: [name] is a 49 y.o. female with a PMHx of hypertension, panic disorder, depression, arthritis, GERD, gallstones, dysrhythmia, who presents to the Emergency Department complaining of moderate, gradually worsening right knee pain onset a few days ago. 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 She states she took tylenol at home with no relief.  Past Medical History  Diagnosis Date  . Hypertension   . Panic disorder   . Depression   . Arthritis   . GERD (gastroesophageal reflux disease)   . Gallstones   . Dysrhythmia     rare pvc    Past Surgical History  Procedure Laterality Date  . Abdominal hysterectomy    . Ercp N/A 03/03/2013    Procedure: ENDOSCOPIC RETROGRADE CHOLANGIOPANCREATOGRAPHY (ERCP);  Surgeon: Theda Belfast, MD;  Location: [facility];  Service: Endoscopy;  Laterality: N/A;  . Eus N/A 03/03/2013    Procedure: UPPER ENDOSCOPIC ULTRASOUND (EUS) LINEAR;  Surgeon: Theda Belfast, MD;  Location: [facility];  Service: Endoscopy;  Laterality: N/A;  . Cholecystectomy N/A 03/14/2013    Procedure: LAPAROSCOPIC CHOLECYSTECTOMY WITH INTRAOPERATIVE CHOLANGIOGRAM;  Surgeon: Velora Heckler, MD;  Location: [facility]  ORS;  Service: General;  Laterality: N/A;   Family History  Problem Relation Age of Onset  . Hypertension Maternal Aunt    History  Substance Use Topics  . Smoking status: Current Every Day Smoker -- 1.50 packs/day for 30 years    Types: Cigarettes  . Smokeless tobacco: Never Used  . Alcohol Use: No   OB History    No data available     Review of Systems  Constitutional: Negative for fever and chills.  Gastrointestinal: Negative for nausea.  Musculoskeletal: Positive for joint swelling and arthralgias.  Skin: Negative for rash.   Allergies  Benadryl  Home Medications   Prior to Admission medications   Medication Sig Start Date End Date Taking? Authorizing Provider  ALPRAZolam Prudy Feeler) 1 MG tablet Take 0.5 tablets (0.5 mg total) by mouth 3 (three) times daily as needed for sleep. For anxiety 09/12/13   Quentin Angst, MD  atenolol (TENORMIN) 50 MG tablet Take 50 mg by mouth every morning.     Historical Provider, MD  citalopram (CELEXA) 20 MG tablet Take 20 mg by mouth every morning.    Historical Provider, MD  HYDROcodone-acetaminophen (NORCO/VICODIN) 5-325 MG per tablet Take 1 tablet by mouth every 6 (six) hours as needed. 03/23/15   Derwood Kaplan, MD  ibuprofen (ADVIL,MOTRIN) 200 MG tablet Take 400-600 mg by mouth every 6 (six) hours as needed for pain.  Historical Provider, MD  lisinopril-hydrochlorothiazide (PRINZIDE,ZESTORETIC) 20-25 MG per tablet Take 0.5 tablets by mouth every morning. 08/23/13   Shanker Levora DredgeM Ghimire, MD  naproxen (NAPROSYN) 375 MG tablet Take 1 tablet (375 mg total) by mouth 2 (two) times daily. 03/23/15   Derwood KaplanAnkit Tejah Brekke, MD  omeprazole (PRILOSEC) 20 MG capsule Take 2 capsules (40 mg total) by mouth daily. 08/23/13   Shanker Levora DredgeM Ghimire, MD   Triage Vitals: BP 123/85 mmHg  Pulse 68  Temp(Src) 98.3 F (36.8 C) (Oral)  Resp 16  Ht 5\' 8"  (1.727 m)  Wt 240 lb (108.863 kg)  BMI 36.50 kg/m2  SpO2 100%  Physical Exam  Constitutional: She is oriented to  person, place, and time. She appears well-developed and well-nourished. No distress.  HENT:  Head: Normocephalic and atraumatic.  Neck: Normal range of motion.  Cardiovascular: Normal rate.   Pulmonary/Chest: Effort normal. No respiratory distress.  Abdominal: There is no tenderness.  Musculoskeletal: She exhibits tenderness.  Right knee is grossly edematous with no overlying erythema, no color. Mild nodule superior to popliteal fossa which is non tender and mobile. Tenderness over the lateral aspect of the knee. Anterior and posterior drawer's test is negative. Negative valgus and varus stress test.  Neurological: She is alert and oriented to person, place, and time. No cranial nerve deficit. Coordination normal.  Skin: No rash noted.  Psychiatric: She has a normal mood and affect. Her behavior is normal.  Nursing note and vitals reviewed.  ED Course  Procedures (including critical care time)  DIAGNOSTIC STUDIES: Oxygen Saturation is 100% on RA, normal by my interpretation.    COORDINATION OF CARE: 12:17 AM- Discussed plans to discharge patient. Advised patient to apply ice to affected area. Will gie patient work note. Will refer patient to orthopedic specialist. Pt advised of plan for treatment and pt agrees.  Labs Review Labs Reviewed - No data to display  Imaging Review Dg Knee Complete 4 Views Right  03/23/2015   CLINICAL DATA:  Right knee pain  EXAM: RIGHT KNEE - COMPLETE 4+ VIEW  COMPARISON:  09/06/2013  FINDINGS: Tricompartmental osteoarthritis with advanced joint narrowing especially in the lateral compartment. There is preferential cartilage loss in the lateral patellofemoral compartment with lateral patellar subluxation. Probable small knee joint effusion. No acute fracture.  IMPRESSION: 1. No acute osseous findings. 2. Tricompartmental osteoarthritis with advanced lateral compartment narrowing.   Electronically Signed   By: Marnee SpringJonathon  Watts M.D.   On: 03/23/2015 00:53      EKG Interpretation None     MDM   Final diagnoses:  Primary osteoarthritis of right knee    I personally performed the services described in this documentation, which was scribed in my presence. The recorded information has been reviewed and is accurate.  Pt comes in with cc of knee pan. There is knee effusion, but no signs of infection/septic joint. Pt ambulating on the knee and ROM is compromised, but not terrible. Additionally, pt is immunocompetent. Will treat as OA flare up.  Derwood KaplanAnkit Krimson Massmann, MD 03/23/15 [phone]

## 2015-03-23 ENCOUNTER — Emergency Department (HOSPITAL_BASED_OUTPATIENT_CLINIC_OR_DEPARTMENT_OTHER): Payer: No Typology Code available for payment source

## 2015-03-23 MED ORDER — NAPROXEN 375 MG PO TABS: 375.0000 mg | ORAL_TABLET | Freq: Two times a day (BID) | ORAL | Status: DC

## 2015-03-23 MED ORDER — IBUPROFEN 200 MG PO TABS
600.0000 mg | ORAL_TABLET | Freq: Once | ORAL | Status: AC
  Administered 2015-03-23: 600 mg via ORAL
  Filled 2015-03-23 (×2): qty 1

## 2015-03-23 MED ORDER — HYDROCODONE-ACETAMINOPHEN 5-325 MG PO TABS
1.0000 | ORAL_TABLET | Freq: Once | ORAL | Status: AC
  Administered 2015-03-23: 1 via ORAL
  Filled 2015-03-23: qty 1

## 2015-03-23 MED ORDER — HYDROCODONE-ACETAMINOPHEN 5-325 MG PO TABS
1.0000 | ORAL_TABLET | Freq: Four times a day (QID) | ORAL | Status: DC | PRN
Start: 2015-03-23 — End: 2015-08-16

## 2015-03-23 NOTE — ED Notes (Signed)
Gave pt a demonstration on how to use crutches pt states that she understands instructions

## 2015-03-23 NOTE — Discharge Instructions (Signed)
You have arthritis flare up. RICE advised (read below). See Orthopedic doctor as requested.   Osteoarthritis Osteoarthritis is a disease that causes soreness and inflammation of a joint. It occurs when the cartilage at the affected joint wears down. Cartilage acts as a cushion, covering the ends of bones where they meet to form a joint. Osteoarthritis is the most common form of arthritis. It often occurs in older people. The joints affected most often by this condition include those in the:  Ends of the fingers.  Thumbs.  Neck.  Lower back.  Knees.  Hips. CAUSES  Over time, the cartilage that covers the ends of bones begins to wear away. This causes bone to rub on bone, producing pain and stiffness in the affected joints.  RISK FACTORS Certain factors can increase your chances of having osteoarthritis, including:  Older age.  Excessive body weight.  Overuse of joints.  Previous joint injury. SIGNS AND SYMPTOMS   Pain, swelling, and stiffness in the joint.  Over time, the joint may lose its normal shape.  Small deposits of bone (osteophytes) may grow on the edges of the joint.  Bits of bone or cartilage can break off and float inside the joint space. This may cause more pain and damage. DIAGNOSIS  Your health care provider will do a physical exam and ask about your symptoms. Various tests may be ordered, such as:  X-rays of the affected joint.  An MRI scan.  Blood tests to rule out other types of arthritis.  Joint fluid tests. This involves using a needle to draw fluid from the joint and examining the fluid under a microscope. TREATMENT  Goals of treatment are to control pain and improve joint function. Treatment plans may include:  A prescribed exercise program that allows for rest and joint relief.  A weight control plan.  Pain relief techniques, such as:  Properly applied heat and cold.  Electric pulses delivered to nerve endings under the skin  (transcutaneous electrical nerve stimulation [TENS]).  Massage.  Certain nutritional supplements.  Medicines to control pain, such as:  Acetaminophen.  Nonsteroidal anti-inflammatory drugs (NSAIDs), such as naproxen.  Narcotic or central-acting agents, such as tramadol.  Corticosteroids. These can be given orally or as an injection.  Surgery to reposition the bones and relieve pain (osteotomy) or to remove loose pieces of bone and cartilage. Joint replacement may be needed in advanced states of osteoarthritis. HOME CARE INSTRUCTIONS   Take medicines only as directed by your health care provider.  Maintain a healthy weight. Follow your health care provider's instructions for weight control. This may include dietary instructions.  Exercise as directed. Your health care provider can recommend specific types of exercise. These may include:  Strengthening exercises. These are done to strengthen the muscles that support joints affected by arthritis. They can be performed with weights or with exercise bands to add resistance.  Aerobic activities. These are exercises, such as brisk walking or low-impact aerobics, that get your heart pumping.  Range-of-motion activities. These keep your joints limber.  Balance and agility exercises. These help you maintain daily living skills.  Rest your affected joints as directed by your health care provider.  Keep all follow-up visits as directed by your health care provider. SEEK MEDICAL CARE IF:   Your skin turns red.  You develop a rash in addition to your joint pain.  You have worsening joint pain.  You have a fever along with joint or muscle aches. SEEK IMMEDIATE MEDICAL CARE IF:  You have a significant loss of weight or appetite.  You have night sweats. FOR MORE INFORMATION   National Institute of Arthritis and Musculoskeletal and Skin Diseases: www.niams.http://www.myers.net/  [employer] on Aging: https://walker.com/  American College  of Rheumatology: www.rheumatology.org Document Released: 11/23/2005 Document Revised: 04/09/2014 Document Reviewed: 07/31/2013 [facility] Patient Information 2015 [place], [place]. This information is not intended to replace advice given to you by your health care provider. Make sure you discuss any questions you have with your health care provider. RICE: Routine Care for Injuries The routine care of many injuries includes Rest, Ice, Compression, and Elevation (RICE). HOME CARE INSTRUCTIONS  Rest is needed to allow your body to heal. Routine activities can usually be resumed when comfortable. Injured tendons and bones can take up to 6 weeks to heal. Tendons are the cord-like structures that attach muscle to bone.  Ice following an injury helps keep the swelling down and reduces pain.  Put ice in a plastic bag.  Place a towel between your skin and the bag.  Leave the ice on for 15-20 minutes, 3-4 times a day, or as directed by your health care provider. Do this while awake, for the first 24 to 48 hours. After that, continue as directed by your caregiver.  Compression helps keep swelling down. It also gives support and helps with discomfort. If an elastic bandage has been applied, it should be removed and reapplied every 3 to 4 hours. It should not be applied tightly, but firmly enough to keep swelling down. Watch fingers or toes for swelling, bluish discoloration, coldness, numbness, or excessive pain. If any of these problems occur, remove the bandage and reapply loosely. Contact your caregiver if these problems continue.  Elevation helps reduce swelling and decreases pain. With extremities, such as the arms, hands, legs, and feet, the injured area should be placed near or above the level of the heart, if possible. SEEK IMMEDIATE MEDICAL CARE IF:  You have persistent pain and swelling.  You develop redness, numbness, or unexpected weakness.  Your symptoms are getting worse rather than  improving after several days. These symptoms may indicate that further evaluation or further X-rays are needed. Sometimes, X-rays may not show a small broken bone (fracture) until 1 week or 10 days later. Make a follow-up appointment with your caregiver. Ask when your X-ray results will be ready. Make sure you get your X-ray results. Document Released: 03/07/2001 Document Revised: 11/28/2013 Document Reviewed: 04/24/2011 [facility] Patient Information 2015 [place], [place]. This information is not intended to replace advice given to you by your health care provider. Make sure you discuss any questions you have with your health care provider.

## 2015-08-16 ENCOUNTER — Emergency Department (HOSPITAL_COMMUNITY)
Admission: EM | Admit: 2015-08-16 | Discharge: 2015-08-16 | Disposition: A | Discharge status: Home / Self Care | Payer: No Typology Code available for payment source | Attending: Emergency Medicine | Admitting: Emergency Medicine

## 2015-08-16 ENCOUNTER — Encounter (HOSPITAL_COMMUNITY): Payer: Self-pay | Admitting: Emergency Medicine

## 2015-08-16 DIAGNOSIS — Z79899 Other long term (current) drug therapy: Secondary | ICD-10-CM | POA: Insufficient documentation

## 2015-08-16 DIAGNOSIS — F410 Panic disorder [episodic paroxysmal anxiety] without agoraphobia: Secondary | ICD-10-CM | POA: Insufficient documentation

## 2015-08-16 DIAGNOSIS — K029 Dental caries, unspecified: Secondary | ICD-10-CM | POA: Insufficient documentation

## 2015-08-16 DIAGNOSIS — Z8719 Personal history of other diseases of the digestive system: Secondary | ICD-10-CM | POA: Insufficient documentation

## 2015-08-16 DIAGNOSIS — Z720 Tobacco use: Secondary | ICD-10-CM | POA: Insufficient documentation

## 2015-08-16 DIAGNOSIS — K047 Periapical abscess without sinus: Secondary | ICD-10-CM | POA: Insufficient documentation

## 2015-08-16 DIAGNOSIS — F329 Major depressive disorder, single episode, unspecified: Secondary | ICD-10-CM | POA: Insufficient documentation

## 2015-08-16 DIAGNOSIS — M1990 Unspecified osteoarthritis, unspecified site: Secondary | ICD-10-CM | POA: Insufficient documentation

## 2015-08-16 DIAGNOSIS — I10 Essential (primary) hypertension: Secondary | ICD-10-CM | POA: Insufficient documentation

## 2015-08-16 MED ORDER — PENICILLIN V POTASSIUM 500 MG PO TABS: 500.0000 mg | ORAL_TABLET | Freq: Four times a day (QID) | ORAL | Status: DC

## 2015-08-16 MED ORDER — HYDROCODONE-ACETAMINOPHEN 5-325 MG PO TABS: 1.0000 | ORAL_TABLET | ORAL | Status: DC | PRN

## 2015-08-16 NOTE — ED Provider Notes (Signed)
CSN: [id number]     Arrival date & time 08/16/15  1541 History  This chart was scribed for non-physician practitioner, Garlon Hatchet, PA-C, working with Laurence Spates, MD, by Budd Palmer ED Scribe. This patient was seen in room WTR8/WTR8 and the patient's care was started at 4:23 PM    Chief Complaint  Patient presents with  . Oral Swelling   The history is provided by the patient. No language interpreter was used.   HPI Comments: [name] is a 49 y.o. female who presents to the Emergency Department complaining of left-sided facial swelling onset 1 day ago. She reports associated constant, aching left-sided dental pain (onset 3 days ago), swelling began last night.  Denies fever, chills, sweats.  No difficulty swallowing or SOB.  She states she has been able to eat and drink normally. She does not have a dentist-- last saw one approx 1 year ago after being referred from ED.  VSS.  Past Medical History  Diagnosis Date  . Hypertension   . Panic disorder   . Depression   . Arthritis   . GERD (gastroesophageal reflux disease)   . Gallstones   . Dysrhythmia     rare pvc    Past Surgical History  Procedure Laterality Date  . Abdominal hysterectomy    . Ercp N/A 03/03/2013    Procedure: ENDOSCOPIC RETROGRADE CHOLANGIOPANCREATOGRAPHY (ERCP);  Surgeon: Theda Belfast, MD;  Location: [facility];  Service: Endoscopy;  Laterality: N/A;  . Eus N/A 03/03/2013    Procedure: UPPER ENDOSCOPIC ULTRASOUND (EUS) LINEAR;  Surgeon: Theda Belfast, MD;  Location: [facility];  Service: Endoscopy;  Laterality: N/A;  . Cholecystectomy N/A 03/14/2013    Procedure: LAPAROSCOPIC CHOLECYSTECTOMY WITH INTRAOPERATIVE CHOLANGIOGRAM;  Surgeon: Velora Heckler, MD;  Location: [facility];  Service: General;  Laterality: N/A;   Family History  Problem Relation Age of Onset  . Hypertension Maternal Aunt    Social History  Substance Use Topics  . Smoking status: Current Every Day Smoker -- 1.50 packs/day  for 30 years    Types: Cigarettes  . Smokeless tobacco: Never Used  . Alcohol Use: No   OB History    No data available     Review of Systems  Constitutional: Negative for fever.  HENT: Positive for dental problem.   All other systems reviewed and are negative.   Allergies  Benadryl  Home Medications   Prior to Admission medications   Medication Sig Start Date End Date Taking? Authorizing Provider  acetaminophen (TYLENOL) 500 MG tablet Take 1,000 mg by mouth every 6 (six) hours as needed for mild pain, moderate pain or headache.   Yes Historical Provider, MD  ALPRAZolam Prudy Feeler) 1 MG tablet Take 0.5 tablets (0.5 mg total) by mouth 3 (three) times daily as needed for sleep. For anxiety 09/12/13  Yes Olugbemiga E Hyman Hopes, MD  atenolol (TENORMIN) 50 MG tablet Take 50 mg by mouth every morning.    Yes Historical Provider, MD  citalopram (CELEXA) 20 MG tablet Take 40 mg by mouth every morning.    Yes Historical Provider, MD  hydrochlorothiazide (HYDRODIURIL) 25 MG tablet Take 25 mg by mouth daily.   Yes Historical Provider, MD  ibuprofen (ADVIL,MOTRIN) 200 MG tablet Take 400-600 mg by mouth every 6 (six) hours as needed for pain.   Yes Historical Provider, MD  omeprazole (PRILOSEC) 20 MG capsule Take 2 capsules (40 mg total) by mouth daily. Patient taking differently: Take 20 mg by mouth daily.  08/23/13  Yes Shanker Levora Dredge, MD  HYDROcodone-acetaminophen (NORCO/VICODIN) 5-325 MG per tablet Take 1 tablet by mouth every 4 (four) hours as needed. 08/16/15   Garlon Hatchet, PA-C  lisinopril-hydrochlorothiazide (PRINZIDE,ZESTORETIC) 20-25 MG per tablet Take 0.5 tablets by mouth every morning. Patient not taking: Reported on 08/16/2015 08/23/13   Maretta Bees, MD  naproxen (NAPROSYN) 375 MG tablet Take 1 tablet (375 mg total) by mouth 2 (two) times daily. Patient not taking: Reported on 08/16/2015 03/23/15   Derwood Kaplan, MD  penicillin v potassium (VEETID) 500 MG tablet Take 1 tablet (500 mg  total) by mouth 4 (four) times daily. 08/16/15   Garlon Hatchet, PA-C   BP 153/95 mmHg  Pulse 90  Temp(Src) 98 F (36.7 C) (Oral)  Resp 16  SpO2 92%   Physical Exam  Constitutional: She is oriented to person, place, and time. She appears well-developed and well-nourished.  HENT:  Head: Normocephalic and atraumatic.  Mouth/Throat: Uvula is midline, oropharynx is clear and moist and mucous membranes are normal. Abnormal dentition. Dental caries present. No oropharyngeal exudate, posterior oropharyngeal edema, posterior oropharyngeal erythema or tonsillar abscesses.  Teeth largely in very poor dentition, left upper pre-molar broken with surrounding swelling and irritation of gingiva noted; no definitive abscess identified; mild swelling of left upper cheek without extension into neck; handling secretions appropriately, no trismus; normal phonation, no stridor  Eyes: Conjunctivae and EOM are normal. Pupils are equal, round, and reactive to light.  Neck: Normal range of motion.  Cardiovascular: Normal rate, regular rhythm and normal heart sounds.   Pulmonary/Chest: Effort normal and breath sounds normal.  Abdominal: Soft. Bowel sounds are normal.  Musculoskeletal: Normal range of motion.  Neurological: She is alert and oriented to person, place, and time.  Skin: Skin is warm and dry.  Psychiatric: She has a normal mood and affect.  Nursing note and vitals reviewed.   ED Course  Procedures  DIAGNOSTIC STUDIES: Oxygen Saturation is 92% on RA, low by my interpretation.    COORDINATION OF CARE: 4:27 PM - Discussed plans to order antibiotics. Will refer to a dentist. Pt advised of plan for treatment and pt agrees.  Labs Review Labs Reviewed - No data to display  Imaging Review No results found. I have personally reviewed and evaluated these images and lab results as part of my medical decision-making.   EKG Interpretation None      MDM   Final diagnoses:  Dental abscess    49 year old female here with left upper dental pain for the past 3 days.  Patient is afebrile, nontoxic. She has swelling of left upper cheek without extension into neck. Severe dental decay of multiple teeth. Patient is handling secretions well, normal phonation without stridor. No definitive or drainable abscess noted on exam, however I have suspicion for underlying infection. Do not suspect Ludwig's angina at this time. Patient was started on antibiotics and referred to dentist.  Discussed plan with patient, he/she acknowledged understanding and agreed with plan of care.  Return precautions given for new or worsening symptoms.  I personally performed the services described in this documentation, which was scribed in my presence. The recorded information has been reviewed and is accurate.  Garlon Hatchet, PA-C 08/16/15 1703  Laurence Spates, MD 08/17/15 [phone]

## 2015-08-16 NOTE — ED Notes (Signed)
C/o left sided dental pain with facial swelling for a couple of days. Has taken tylenol w/o relief. Does not have a dentist. Pain 6/10

## 2015-08-16 NOTE — Discharge Instructions (Signed)
Take the prescribed medication as directed. Follow-up with dentist.  There is no dentist on call today-- use resource guide to help find dentist in the area. Return to the ED for new or worsening symptoms.   Emergency Department Resource Guide 1) Find a Doctor and Pay Out of Pocket Although you won't have to find out who is covered by your insurance plan, it is a good idea to ask around and get recommendations. You will then need to call the office and see if the doctor you have chosen will accept you as a new patient and what types of options they offer for patients who are self-pay. Some doctors offer discounts or will set up payment plans for their patients who do not have insurance, but you will need to ask so you aren't surprised when you get to your appointment.  2) Contact Your Local Health Department Not all health departments have doctors that can see patients for sick visits, but many do, so it is worth a call to see if yours does. If you don't know where your local health department is, you can check in your phone book. The CDC also has a tool to help you locate your state's health department, and many state websites also have listings of all of their local health departments.  3) Find a Walk-in Clinic If your illness is not likely to be very severe or complicated, you may want to try a walk in clinic. These are popping up all over the country in pharmacies, drugstores, and shopping centers. They're usually staffed by nurse practitioners or physician assistants that have been trained to treat common illnesses and complaints. They're usually fairly quick and inexpensive. However, if you have serious medical issues or chronic medical problems, these are probably not your best option.  No Primary Care Doctor: - Call Health Connect at  [phone] - they can help you locate a primary care doctor that  accepts your insurance, provides certain services, etc. - Physician Referral Service-  [phone]  Chronic Pain Problems: Organization         Address  Phone   Notes  [facility]  [phone] Patients need to be referred by their primary care doctor.   Medication Assistance: Organization         Address  Phone   Notes  [facility] Medication [facility] [address] [place] [place], [place] [address] [phone] --Must be a resident of [facility] -- Must have NO insurance coverage whatsoever (no Medicaid/ Medicare, etc.) -- The pt. MUST have a primary care doctor that directs their care regularly and follows them in the community   [facility]ist  [phone]   [employer]  [phone]    Agencies that provide inexpensive medical care: Organization         Address  Phone   Notes  [facility]  [phone]   [facility]    [phone]   [facility] [address] [place], [place] [address] [phone]   [facility] [address] [place], [place] [phone]   Planned Parenthood    [phone]   [facility]    [phone]   [facility]  [address], [place] Phone:  [phone], Fax:  [phone] Hours of Operation:  9 am - 6 pm, M-F.  Also accepts Medicaid/Medicare and self-pay.  [place]  Center for [place], [address], [place] Phone: [phone], Fax: [phone]. Hours of Operation:  8:30 am - 5:30 pm, M-F.  Also accepts Medicaid and self-pay.  [facility] [place] [address] [place] Phone: [phone]   [place], [place], [place] [phone], Ext. [phone] Mondays & Thursdays: 7-9 AM.  First 15 patients are seen on a first come, first serve basis.    [place] Providers:  Organization         Address  Phone   Notes  [facility] [address],  [place] [phone] Also accepts self-pay patients.  [facility] [id number] [place], [place]  [phone]   [place], [address], [place] [phone]   [facility] [address] [place] [phone]   Lucianne Lei [address], [place]   [phone] Only accepts [place] Access [place] patients after they have their name applied to their card.   Self-Pay (no insurance) in [facility]:  Organization         Address  Phone   Notes  Sickle Cell Patients, [facility] [place] [phone]   [facility] Urgent Care [place] [phone]   [facility] [place]  [place], [place], [place] [phone]   [facility]/Dr. Osei-Bonsu  [address], [place] or [address], [place] [phone] Phone number for both [place] and [place] locations is the same.  [facility] [address] [place] [phone]   [facility] [address], [place] or [address] [phone]   [facility] [address], [place] [phone], phone; [phone], fax Sees patients 1st and 3rd Saturday of every month.  Must not qualify for public or private insurance (i.e. Medicaid, Medicare, [facility] Health Choice, Veterans' Benefits)  Household income should be no more than 200% of the poverty level The clinic cannot treat you if you are pregnant or think you are pregnant  Sexually transmitted diseases are not treated at the clinic.    Dental Care: Organization         Address  Phone  Notes  [facility] Department of [facility] [place] [phone] Accepts children up to age 102 who are enrolled in  [place] or [place]; pregnant women with a Medicaid card; and children who have applied for Medicaid or [place] Health Choice, but were declined, whose parents can pay a reduced fee at time of service.  [facility] Department of [facility]  [address], [place] [phone] Accepts children up to age 74 who are enrolled in [place] or [place]; pregnant women with a Medicaid card; and children who have applied for Medicaid or [place] Health Choice, but were declined, whose parents can pay a reduced fee at time of service.  [facility]  [place] [phone] Patients are seen by appointment only. Walk-ins are not accepted. [place] will see patients 40 years of age and older. Monday - Tuesday (8am-5pm) Most Wednesdays (8:30-5pm) $30 per visit, cash  only  [facility] Adult [facility] Access PROGRAM  [address], [facility] [phone] Patients are seen by appointment only. Walk-ins are not accepted. [place] will see patients 14 years of age and older. One Wednesday Evening (Monthly: Volunteer Based).  $30 per visit, cash only  [place]  [phone] for adults; Children under age 48, call Graduate Pediatric Dentistry at [phone]. Children aged 39-14, please call [phone] to request a pediatric application.  Dental services are provided in all areas of dental care including fillings, crowns and bridges, complete and partial dentures, implants, gum treatment, root canals, and extractions. Preventive care is also provided. Treatment is provided to both adults and children. Patients are selected via a lottery and there is often a waiting list.   [facility] [address] [place]  [phone] [email or web address]   [facility] [address] [place], [place] [phone], Ext. [phone] Second and Fourth Thursday of each month, opens at 6:30  AM; Clinic ends at 9 AM.  Patients are seen on a first-come first-served basis, and a limited number are seen during each clinic.   [facility]  [address] Hillard Danker [place], [place] [phone]   Eligibility Requirements You must have lived in [place], [place], or [place] counties for at least the last three months.   You cannot be eligible for state or federal sponsored [employer], including [employer], [place], or [employer].   You generally cannot be eligible for healthcare insurance through your employer.    How to apply: Eligibility screenings are held every Tuesday and Wednesday afternoon from 1:00 pm until 4:00 pm. You do not need an appointment for the interview!  [facility] [address], [place], [place]   [place]  [place] Department  [place]  [phone]    Behavioral Health Resources in the Community: Intensive Outpatient Programs Organization         Address  Phone  Notes  [place]. [address], [place], [place] [phone]   [facility] Outpatient [address] [place], [place]   ADS: Alcohol & Drug Svcs [address], [place], [place]   [place] [address],  [place], [place] or [phone]   Substance Abuse Resources Organization         Address  Phone  Notes  Alcohol and Drug Services  [phone]   [place]  [phone]   The [place]   Chinita Pester  [phone]   Residential & Outpatient Substance Abuse Program  [phone]   Psychological Services Organization         Address  Phone  Notes  [facility] [place]  [place]  [phone]   [place] [address], [place] or  [phone]    Mobile Crisis Teams Organization         Address  Phone  Notes  [facility], Mobile Crisis Care Unit  [phone]   [facility]  [address] [place], [place]   [place] Levels [address] [place] [phone]    Self-Help/Support Groups Organization         Address  Phone  Notes  [facility]ental Health Assoc[facility] of [place] - variety of support groups  [place] Call for more information  Narcotics Anonymous (NA), [facility]aring [facility] [address], [employer] [place]  2 meetings at this location   Special educational needs teacher         Address  Phone  Notes  ASAP Residential Treatment [place],    [place]  [phone]   [facility]  [address] [place] [phone], [place], [place]   [place], [place] [phone] Admissions: 8am-3pm M-F  Incentives Substance [place] [address],    [place], [place] [phone]   [facility] [address] [place], [place], [place]   [facility] [address]  [place], [place]   [facility] Programs - Intensive Outpatient [address] Kristeen Mans [address], [place], [place]   [facility].,  [place], [place] [phone] or [phone]   [facility] Treatment Services (RTS) [address], [place], [place] Accepts Medicaid  [facility] [place] [address]  [place] [phone] Substance Abuse/Addiction Treatment   [facility] Organization         Address  Phone  Notes  [facility]  [phone]   Domenic Schwab, PhD [address] Arlis Porta [place], [place]   [phone] or [phone]   [place], [place] [phone]   [facility] [address],  [place], [place] [phone] Insurance/Medicaid/sponsorship through [facility] and Families [address], [address]                                    [place], [place] [phone] [place] [address] [place], [place] [phone]    Dr. Adele Schilder  [phone]   [facility] Dept. 1) [address] [place] 2) [place] 3)  [place] [address], [place] [phone]  [phone]   [place] [phone] or [phone] (After Hours)

## 2016-10-06 ENCOUNTER — Ambulatory Visit (INDEPENDENT_AMBULATORY_CARE_PROVIDER_SITE_OTHER): Payer: PRIVATE HEALTH INSURANCE | Admitting: Physician Assistant

## 2016-10-06 VITALS — BP 144/80 | HR 78 | Temp 98.1°F | Resp 20 | Ht 68.0 in | Wt 235.2 lb

## 2016-10-06 DIAGNOSIS — I10 Essential (primary) hypertension: Secondary | ICD-10-CM | POA: Diagnosis not present

## 2016-10-06 DIAGNOSIS — F410 Panic disorder [episodic paroxysmal anxiety] without agoraphobia: Secondary | ICD-10-CM | POA: Diagnosis not present

## 2016-10-06 MED ORDER — ALPRAZOLAM 1 MG PO TABS: 0.5000 mg | ORAL_TABLET | Freq: Three times a day (TID) | ORAL | 0 refills | Status: DC | PRN

## 2016-10-06 MED ORDER — HYDROCHLOROTHIAZIDE 25 MG PO TABS: 25.0000 mg | ORAL_TABLET | Freq: Every day | ORAL | 5 refills | Status: DC

## 2016-10-06 MED ORDER — ATENOLOL 50 MG PO TABS: 50.0000 mg | ORAL_TABLET | Freq: Every morning | ORAL | 5 refills | Status: DC

## 2016-10-06 MED ORDER — CITALOPRAM HYDROBROMIDE 20 MG PO TABS: 40.0000 mg | ORAL_TABLET | Freq: Every morning | ORAL | 5 refills | Status: DC

## 2016-10-06 NOTE — Progress Notes (Addendum)
Patient ID: [name], female   DOB: [birth year], 50 y.o.   MRN: [id number] [facility] [address], [place] [id number] [phone]  By signing my name below, I, Essence Howell, attest that this documentation has been prepared under the direction and in the presence of Trena PlattStephanie English, PA-C Electronically Signed: Charline BillsEssence Howell, ED Scribe 10/06/2016 at 4:38 PM.  Date:  10/06/2016   Name:  [name]   DOB:  [birth year]   MRN:  [id number]  PCP:  Jeanann LewandowskyJEGEDE, OLUGBEMIGA, MD   History of Present Illness:  [name] is a 50 y.o. female patient who [facility]ill of atenolol, Celexa, xanax and HCTZ. Pt states that she was Lisinopril-HCTZ for BP but was switched to HCTZ because Lisinopril was making her BP too low. She states that she ran out of the medication 3 weeks ago and has had a lot of leg swelling. She denies chest pain, palpitations, sob. She recently increased her dose to 40 mg of Celexa for panic disorder; states that she has not had any panic disorders since being on the medications. 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 Pt is a current smoker; smokes 1.5 ppd. She reports the desire to quit but states that her nerves will not let her quit. She went a month without smoking but returned to smoking when her mother got ill and passed. Pt states that she cannot afford a psychiatrist. She was followed by Dr. Wynelle LinkSun but states that she missed a payment and was dropped from the clinic. She [facility] also [facility] and Dr. Oneta RackMcKeown.   Patient Active Problem List   Diagnosis Date Noted   Prediabetes 09/12/2013   Dyslipidemia (high LDL; low HDL) 09/12/2013   Arthritis of both knees 09/08/2013   Cholelithiasis with cholecystitis 03/13/2013   Choledocholithiasis, status post ERCP 03/13/2013   CERUMEN IMPACTION, RIGHT 10/28/2010    LUMBAGO 07/31/2010   GANGLION CYST, WRIST, RIGHT 07/31/2010   CANDIDIASIS, VAGINAL 02/07/2009   TMJ SYNDROME 02/07/2009   PREMATURE VENTRICULAR CONTRACTIONS 12/10/2008   PANIC DISORDER 10/17/2008   TOBACCO ABUSE 10/17/2008   HYPERLIPIDEMIA 07/17/2008   GERD 03/19/2008   BREAST MASS, RIGHT 12/07/2006   HYPERTENSION, BENIGN ESSENTIAL 12/07/2005    Past Medical History:  Diagnosis Date   Arthritis    Depression    Dysrhythmia    rare pvc    Gallstones    GERD (gastroesophageal reflux disease)    Hypertension    Panic disorder     Past Surgical History:  Procedure Laterality Date   ABDOMINAL HYSTERECTOMY     CHOLECYSTECTOMY N/A 03/14/2013   Procedure: LAPAROSCOPIC CHOLECYSTECTOMY WITH INTRAOPERATIVE CHOLANGIOGRAM;  Surgeon: Velora Hecklerodd M Gerkin, MD;  Location: [facility];  Service: General;  Laterality: N/A;   ERCP N/A 03/03/2013   Procedure: ENDOSCOPIC RETROGRADE CHOLANGIOPANCREATOGRAPHY (ERCP);  Surgeon: Theda BelfastPatrick D Hung, MD;  [facility];  Service: Endoscopy;  Laterality: N/A;   EUS N/A 03/03/2013   Procedure: UPPER ENDOSCOPIC ULTRASOUND (EUS) LINEAR;  Surgeon: Theda BelfastPatrick D Hung, MD;  Location: [facility];  Service: Endoscopy;  Laterality: N/A;    Social History  Substance Use Topics   Smoking status: Current Every Day Smoker    Packs/day: 1.50    Years: 30.00    Types: Cigarettes   Smokeless tobacco: Never Used  Alcohol use No    Family History  Problem Relation Age of Onset   Hypertension Maternal Aunt     Allergies  Allergen Reactions   Benadryl [Diphenhydramine]     Throat closes up    Medication list has been reviewed and updated.  Current Outpatient Prescriptions on File Prior to Visit  Medication Sig Dispense Refill   atenolol (TENORMIN) 50 MG tablet Take 50 mg by mouth every morning.      citalopram (CELEXA) 20 MG tablet Take 40 mg by mouth every morning.      hydrochlorothiazide (HYDRODIURIL) 25 MG tablet Take 25 mg by  mouth daily.     HYDROcodone-acetaminophen (NORCO/VICODIN) 5-325 MG per tablet Take 1 tablet by mouth every 4 (four) hours as needed. 10 tablet 0   lisinopril-hydrochlorothiazide (PRINZIDE,ZESTORETIC) 20-25 MG per tablet Take 0.5 tablets by mouth every morning. 30 tablet 3   naproxen (NAPROSYN) 375 MG tablet Take 1 tablet (375 mg total) by mouth 2 (two) times daily. 20 tablet 0   omeprazole (PRILOSEC) 20 MG capsule Take 2 capsules (40 mg total) by mouth daily. (Patient taking differently: Take 20 mg by mouth daily. ) 30 capsule 3   penicillin v potassium (VEETID) 500 MG tablet Take 1 tablet (500 mg total) by mouth 4 (four) times daily. 40 tablet 0   acetaminophen (TYLENOL) 500 MG tablet Take 1,000 mg by mouth every 6 (six) hours as needed for mild pain, moderate pain or headache.     ALPRAZolam (XANAX) 1 MG tablet Take 0.5 tablets (0.5 mg total) by mouth 3 (three) times daily as needed for sleep. For anxiety (Patient not taking: Reported on 10/06/2016) 90 tablet 2   ibuprofen (ADVIL,MOTRIN) 200 MG tablet Take 400-600 mg by mouth every 6 (six) hours as needed for pain.     No current facility-administered medications on file prior to visit.     Review of Systems  Respiratory: Negative for shortness of breath.   Cardiovascular: Positive for leg swelling. Negative for chest pain and palpitations.    Physical Examination: BP (!) 144/80 (BP Location: Right Arm, Patient Position: Sitting, Cuff Size: Large)    Pulse 78    Temp 98.1 F (36.7 C) (Oral)    Resp 20    Ht 5\' 8"  (1.727 m)    Wt 235 lb 3.2 oz (106.7 kg)    SpO2 95%    BMI 35.76 kg/m  Ideal Body Weight: @FLOWAMB ([id number])@  Physical Exam  Constitutional: She is oriented to person, place, and time. She appears well-developed and well-nourished. No distress.  HENT:  Head: Normocephalic and atraumatic.  Right Ear: External ear normal.  Left Ear: External ear normal.  Eyes: Conjunctivae and EOM are normal. Pupils are equal,  round, and reactive to light.  Cardiovascular: Normal rate, regular rhythm and normal heart sounds.  Exam reveals no gallop and no friction rub.   No murmur heard. Pulmonary/Chest: Effort normal and breath sounds normal. No respiratory distress.  Neurological: She is alert and oriented to person, place, and time.  Skin: She is not diaphoretic.  Psychiatric: She has a normal mood and affect. Her behavior is normal.   Assessment and Plan: MICHELYN SCULLIN is a 50 y.o. female who is here today for medication refill. We will start the following medications.  Paperwork release filled out so that we may obtain medical records from [facility]cp[facility]??  If we are unable to see a more present lab, she has been instructed to return for bmetgfr. Will  refill xanax at this time.  If she consistently returns here, will need to obtain paperwork for controlled substance issuance. Essential hypertension - Plan: hydrochlorothiazide (HYDRODIURIL) 25 MG tablet, atenolol (TENORMIN) 50 MG tablet, CANCELED: POCT urinalysis dipstick, CANCELED: Basic metabolic panel  Panic disorder without agoraphobia - Plan: citalopram (CELEXA) 20 MG tablet, ALPRAZolam (XANAX) 1 MG tablet  Trena PlattStephanie English, PA-C [facility] 11/5/20178:10 PM

## 2016-10-06 NOTE — Patient Instructions (Addendum)
Please take medication as prescribed.  DASH Eating Plan DASH stands for "Dietary Approaches to Stop Hypertension." The DASH eating plan is a healthy eating plan that has been shown to reduce high blood pressure (hypertension). Additional health benefits may include reducing the risk of type 2 diabetes mellitus, heart disease, and stroke. The DASH eating plan may also help with weight loss. WHAT DO I NEED TO KNOW ABOUT THE DASH EATING PLAN? For the DASH eating plan, you will follow these general guidelines:  Choose foods with a percent daily value for sodium of less than 5% (as listed on the food label).  Use salt-free seasonings or herbs instead of table salt or sea salt.  Check with your health care provider or pharmacist before using salt substitutes.  Eat lower-sodium products, often labeled as "lower sodium" or "no salt added."  Eat fresh foods.  Eat more vegetables, fruits, and low-fat dairy products.  Choose whole grains. Look for the word "whole" as the first word in the ingredient list.  Choose fish and skinless chicken or Mal[place]key more often than red meat. Limit fish, poultry, and meat to 6 oz (170 g) each day.  Limit sweets, desserts, sugars, and sugary drinks.  Choose heart-healthy fats.  Limit cheese to 1 oz (28 g) per day.  Eat more home-cooked food and less restaurant, buffet, and fast food.  Limit fried foods.  Cook foods using methods other than frying.  Limit canned vegetables. If you do use them, rinse them well to decrease the sodium.  When eating at a restaurant, ask that your food be prepared with less salt, or no salt if possible. WHAT FOODS CAN I EAT? Seek help from a dietitian for individual calorie needs. Grains Whole grain or whole wheat bread. Brown rice. Whole grain or whole wheat pasta. Quinoa, bulgur, and whole grain cereals. Low-sodium cereals. Corn or whole wheat flour tortillas. Whole grain cornbread. Whole grain crackers. Low-sodium  crackers. Vegetables Fresh or frozen vegetables (raw, steamed, roasted, or grilled). Low-sodium or reduced-sodium tomato and vegetable juices. Low-sodium or reduced-sodium tomato sauce and paste. Low-sodium or reduced-sodium canned vegetables.  Fruits All fresh, canned (in natural juice), or frozen fruits. Meat and Other Protein Products Ground beef (85% or leaner), grass-fed beef, or beef trimmed of fat. Skinless chicken or [place]key. Ground chicken or [place]urkey. Pork trimmed of fat. All fish and seafood. Eggs. Dried beans, peas, or lentils. Unsalted nuts and seeds. Unsalted canned beans. Dairy Low-fat dairy products, such as skim or 1% milk, 2% or reduced-fat cheeses, low-fat ricotta or cottage cheese, or plain low-fat yogurt. Low-sodium or reduced-sodium cheeses. Fats and Oils Tub margarines without trans fats. Light or reduced-fat mayonnaise and salad dressings (reduced sodium). Avocado. Safflower, olive, or canola oils. Natural peanut or almond butter. Other Unsalted popcorn and pretzels. The items listed above may not be a complete list of recommended foods or beverages. Contact your dietitian for more options. WHAT FOODS ARE NOT RECOMMENDED? Grains White bread. White pasta. White rice. Refined cornbread. Bagels and croissants. Crackers that contain trans fat. Vegetables Creamed or fried vegetables. Vegetables in a cheese sauce. Regular canned vegetables. Regular canned tomato sauce and paste. Regular tomato and vegetable juices. Fruits Dried fruits. Canned fruit in light or heavy syrup. Fruit juice. Meat and Other Protein Products Fatty cuts of meat. Ribs, chicken wings, bacon, sausage, bologna, salami, chitterlings, fatback, hot dogs, bratwurst, and packaged luncheon meats. Salted nuts and seeds. Canned beans with salt. Dairy Whole or 2% milk, cream, half-and-half, and cream  cheese. Whole-fat or sweetened yogurt. Full-fat cheeses or blue cheese. Nondairy creamers and whipped toppings.  Processed cheese, cheese spreads, or cheese curds. Condiments Onion and garlic salt, seasoned salt, table salt, and sea salt. Canned and packaged gravies. Worcestershire sauce. Tartar sauce. Barbecue sauce. Teriyaki sauce. Soy sauce, including reduced sodium. Steak sauce. Fish sauce. Oyster sauce. Cocktail sauce. Horseradish. Ketchup and mustard. Meat flavorings and tenderizers. Bouillon cubes. Hot sauce. Tabasco sauce. Marinades. Taco seasonings. Relishes. Fats and Oils Butter, stick margarine, lard, shortening, ghee, and bacon fat. Coconut, palm kernel, or palm oils. Regular salad dressings. Other Pickles and olives. Salted popcorn and pretzels. The items listed above may not be a complete list of foods and beverages to avoid. Contact your dietitian for more information. WHERE CAN I FIND MORE INFORMATION? National Heart, Lung, and Blood Institute: CablePromo.itwww.nhlbi.nih.gov/health/health-topics/topics/dash/   This information is not intended to replace advice given to you by your health care provider. Make sure you discuss any questions you have with your health care provider.   Document Released: 11/12/2011 Document Revised: 12/14/2014 Document Reviewed: 09/27/2013 Elsevier Interactive Patient Education 2016 [employer]    IF you received an x-ray today, you will receive an invoice from [facility]. Please contact [facility] at [phone] with questions or concerns regarding your invoice.   IF you received labwork today, you will receive an invoice from [facility]. Please contact [facility] at [phone] with questions or concerns regarding your invoice.   Our billing staff will not be able to assist you with questions regarding bills from these companies.  You will be contacted with the lab results as soon as they are available. The fastest way to get your results is to activate your My Chart account. Instructions are located on the last page of  this paperwork. If you have not heard from us regarding the results in 2 weeks, please contact this office.     I will follow up with the lab result.  Please return within 6 months.  Sooner if lab proves otherwise.

## 2016-10-07 ENCOUNTER — Telehealth: Payer: Self-pay

## 2016-10-07 NOTE — Telephone Encounter (Signed)
Tried to reach  Patient by phone.  Unable to leave message    Which of the nine [facility] location do you want your records sent to?

## 2016-10-31 ENCOUNTER — Telehealth: Payer: Self-pay

## 2016-10-31 NOTE — Telephone Encounter (Signed)
Pt is needing a refill on alprazalam rx but the way the rx is written it is to early to refill -pharmacy states that it cant be done til dec 3 and she only filled 1/2 of the 90 day supply  Best number [phone]

## 2016-11-04 NOTE — Telephone Encounter (Signed)
Attempted to call pt, unable to leave VM.  Contacted pharmacy and they are unable to refill because pt was given 45pills on 10/11/16 and if she is taking them as instructed she shouldn't need more until dec 3.

## 2016-12-04 ENCOUNTER — Telehealth: Payer: Self-pay

## 2016-12-04 NOTE — Telephone Encounter (Signed)
Fax req for refill - Xanax #[phone].  Pharmacy wrote  Pt wants 1/2 - 1 tab 3 times per day.  (these are directions on previous prescriptions-per note)  Because of the directions we wrote, it is too early and pt had pharmacy contact us.  IC & spoke with pharmacist - Pt needs OV and needs to [place] [facility] [place] [facility] if we are to continue prescribing this med - per note by [place]. Pharmacist will advise pt.

## 2016-12-12 NOTE — Telephone Encounter (Signed)
Error

## 2016-12-29 ENCOUNTER — Ambulatory Visit (INDEPENDENT_AMBULATORY_CARE_PROVIDER_SITE_OTHER): Payer: PRIVATE HEALTH INSURANCE | Admitting: Physician Assistant

## 2016-12-29 ENCOUNTER — Encounter: Payer: Self-pay | Admitting: Physician Assistant

## 2016-12-29 VITALS — BP 146/79 | HR 87 | Temp 98.6°F | Resp 16 | Ht 68.0 in | Wt 234.4 lb

## 2016-12-29 DIAGNOSIS — F411 Generalized anxiety disorder: Secondary | ICD-10-CM | POA: Diagnosis not present

## 2016-12-29 MED ORDER — ALPRAZOLAM 1 MG PO TABS: 0.5000 mg | ORAL_TABLET | Freq: Three times a day (TID) | ORAL | 0 refills | Status: DC

## 2016-12-29 NOTE — Progress Notes (Signed)
[facility] [address], [place] [address] [phone]  Date:  12/29/2016   Name:  Alice PoissonKamala L [name]   DOB:  1966/07/13   MRN:  [id number]  PCP:  Jeanann LewandowskyJEGEDE, OLUGBEMIGA, MD    History of Present Illness:  [name] is a 51 y.o. female patient [facility].  She has been using .5-1 tablet three times per day.   She does not have panic attacks with using the medicine three times per day.   She is taking the celexa.  She does not think they work for her panic disorder.      Patient Active Problem List   Diagnosis Date Noted  . Prediabetes 09/12/2013  . Dyslipidemia (high LDL; low HDL) 09/12/2013  . Arthritis of both knees 09/08/2013  . Cholelithiasis with cholecystitis 03/13/2013  . Choledocholithiasis, status post ERCP 03/13/2013  . CERUMEN IMPACTION, RIGHT 10/28/2010  . LUMBAGO 07/31/2010  . GANGLION CYST, WRIST, RIGHT 07/31/2010  . CANDIDIASIS, VAGINAL 02/07/2009  . TMJ SYNDROME 02/07/2009  . PREMATURE VENTRICULAR CONTRACTIONS 12/10/2008  . PANIC DISORDER 10/17/2008  . TOBACCO ABUSE 10/17/2008  . HYPERLIPIDEMIA 07/17/2008  . GERD 03/19/2008  . BREAST MASS, RIGHT 12/07/2006  . HYPERTENSION, BENIGN ESSENTIAL 12/07/2005    Past Medical History:  Diagnosis Date  . Arthritis   . Depression   . Dysrhythmia    rare pvc   . Gallstones   . GERD (gastroesophageal reflux disease)   . Hypertension   . Panic disorder     Past Surgical History:  Procedure Laterality Date  . ABDOMINAL HYSTERECTOMY    . CHOLECYSTECTOMY N/A 03/14/2013   Procedure: LAPAROSCOPIC CHOLECYSTECTOMY WITH INTRAOPERATIVE CHOLANGIOGRAM;  Surgeon: Velora Hecklerodd M Gerkin, MD;  Location: [facility];  Service: General;  Laterality: N/A;  . ERCP N/A 03/03/2013   Procedure: ENDOSCOPIC RETROGRADE CHOLANGIOPANCREATOGRAPHY (ERCP);  Surgeon: Theda BelfastPatrick D Hung, MD;  [facility];  Service: Endoscopy;  Laterality: N/A;  . EUS N/A 03/03/2013   Procedure: UPPER  ENDOSCOPIC ULTRASOUND (EUS) LINEAR;  Surgeon: Theda BelfastPatrick D Hung, MD;  Location: [facility];  Service: Endoscopy;  Laterality: N/A;    Social History  Substance Use Topics  . Smoking status: Current Every Day Smoker    Packs/day: 1.50    Years: 30.00    Types: Cigarettes  . Smokeless tobacco: Never Used  . Alcohol use No    Family History  Problem Relation Age of Onset  . Hypertension Maternal Aunt     Allergies  Allergen Reactions  . Benadryl [Diphenhydramine]     Throat closes up    Medication list has been reviewed and updated.  Current Outpatient Prescriptions on File Prior to Visit  Medication Sig Dispense Refill  . acetaminophen (TYLENOL) 500 MG tablet Take 1,000 mg by mouth every 6 (six) hours as needed for mild pain, moderate pain or headache.    . ALPRAZolam (XANAX) 1 MG tablet Take 0.5 tablets (0.5 mg total) by mouth 3 (three) times daily as needed for sleep. For anxiety 90 tablet 0  . atenolol (TENORMIN) 50 MG tablet Take 1 tablet (50 mg total) by mouth every morning. 30 tablet 5  . citalopram (CELEXA) 20 MG tablet Take 2 tablets (40 mg total) by mouth every morning. 60 tablet 5  . hydrochlorothiazide (HYDRODIURIL) 25 MG tablet Take 1 tablet (25 mg total) by mouth daily. 30 tablet 5  . HYDROcodone-acetaminophen (NORCO/VICODIN) 5-325 MG per tablet Take 1 tablet by mouth every  4 (four) hours as needed. (Patient not taking: Reported on 12/29/2016) 10 tablet 0  . ibuprofen (ADVIL,MOTRIN) 200 MG tablet Take 400-600 mg by mouth every 6 (six) hours as needed for pain.    Marland Kitchen lisinopril-hydrochlorothiazide (PRINZIDE,ZESTORETIC) 20-25 MG per tablet Take 0.5 tablets by mouth every morning. (Patient not taking: Reported on 12/29/2016) 30 tablet 3  . naproxen (NAPROSYN) 375 MG tablet Take 1 tablet (375 mg total) by mouth 2 (two) times daily. (Patient not taking: Reported on 12/29/2016) 20 tablet 0  . omeprazole (PRILOSEC) 20 MG capsule Take 2 capsules (40 mg total) by mouth daily.  (Patient not taking: Reported on 12/29/2016) 30 capsule 3  . penicillin v potassium (VEETID) 500 MG tablet Take 1 tablet (500 mg total) by mouth 4 (four) times daily. (Patient not taking: Reported on 12/29/2016) 40 tablet 0   No current facility-administered medications on file prior to visit.     ROS ROS otherwise unremarkable unless listed above.  Physical Examination: BP (!) 146/79 (BP Location: Right Arm, Patient Position: Sitting, Cuff Size: Small)   Pulse 87   Temp 98.6 F (37 C) (Oral)   Resp 16   Ht 5\' 8"  (1.727 m)   Wt 234 lb 6.4 oz (106.3 kg)   SpO2 94%   BMI 35.64 kg/m  Ideal Body Weight: Weight in (lb) to have BMI = 25: 164.1  Physical Exam  Constitutional: She is oriented to person, place, and time. She appears well-developed and well-nourished. No distress.  HENT:  Head: Normocephalic and atraumatic.  Right Ear: External ear normal.  Left Ear: External ear normal.  Eyes: Conjunctivae and EOM are normal. Pupils are equal, round, and reactive to light.  Cardiovascular: Normal rate.   Pulmonary/Chest: Effort normal. No respiratory distress.  Neurological: She is alert and oriented to person, place, and time.  Skin: She is not diaphoretic.  Psychiatric: She has a normal mood and affect. Her behavior is normal.     Assessment and Plan: [name] is a 51 y.o. female who is here today for medication refill. Will increase to past dosing which was confirmed in control database.   She will need to return in 3 months, and may refill twice.  Advised that she will need to return for a pcp. Generalized anxiety disorder - Plan: ALPRAZolam Prudy Feeler) 1 MG tablet  Anxiety state - Plan: TSH, ALPRAZolam Prudy Feeler) 1 MG tablet  Trena Platt, PA-C [facility] 1/27/20186:14 PM

## 2016-12-29 NOTE — Patient Instructions (Signed)
Refilling for 1 month.  We can refill for 3 months.  At that time, we will establish care, and you will need to sign a controlled.

## 2016-12-30 LAB — TSH: TSH: 0.683 u[IU]/mL (ref 0.450–4.500)

## 2017-02-03 ENCOUNTER — Other Ambulatory Visit: Payer: Self-pay | Admitting: Physician Assistant

## 2017-02-03 NOTE — Telephone Encounter (Signed)
Please review

## 2017-02-08 ENCOUNTER — Other Ambulatory Visit: Payer: Self-pay | Admitting: Physician Assistant

## 2017-02-08 NOTE — Telephone Encounter (Signed)
Was this rx 02/03/17? I dont see it called in?

## 2017-02-09 NOTE — Telephone Encounter (Signed)
Appears so. I ordered it.  I remember approving it.  But you will have to check with the patient of pick up, etc.

## 2017-02-09 NOTE — Telephone Encounter (Signed)
Phoned in.

## 2017-03-26 ENCOUNTER — Other Ambulatory Visit: Payer: Self-pay | Admitting: Physician Assistant

## 2017-03-27 NOTE — Telephone Encounter (Signed)
12/29/16 last ov and refill

## 2017-03-31 ENCOUNTER — Other Ambulatory Visit: Payer: Self-pay | Admitting: Physician Assistant

## 2017-04-06 ENCOUNTER — Encounter: Payer: Self-pay | Admitting: Physician Assistant

## 2017-04-06 ENCOUNTER — Ambulatory Visit (INDEPENDENT_AMBULATORY_CARE_PROVIDER_SITE_OTHER): Payer: PRIVATE HEALTH INSURANCE | Admitting: Physician Assistant

## 2017-04-06 VITALS — BP 148/93 | HR 63 | Temp 98.0°F | Resp 16 | Ht 68.0 in | Wt 223.0 lb

## 2017-04-06 DIAGNOSIS — F410 Panic disorder [episodic paroxysmal anxiety] without agoraphobia: Secondary | ICD-10-CM

## 2017-04-06 DIAGNOSIS — F411 Generalized anxiety disorder: Secondary | ICD-10-CM

## 2017-04-06 DIAGNOSIS — Z1211 Encounter for screening for malignant neoplasm of colon: Secondary | ICD-10-CM

## 2017-04-06 DIAGNOSIS — N63 Unspecified lump in unspecified breast: Secondary | ICD-10-CM

## 2017-04-06 DIAGNOSIS — F17200 Nicotine dependence, unspecified, uncomplicated: Secondary | ICD-10-CM | POA: Diagnosis not present

## 2017-04-06 DIAGNOSIS — Z7689 Persons encountering health services in other specified circumstances: Secondary | ICD-10-CM

## 2017-04-06 DIAGNOSIS — R7303 Prediabetes: Secondary | ICD-10-CM

## 2017-04-06 DIAGNOSIS — Z79899 Other long term (current) drug therapy: Secondary | ICD-10-CM | POA: Insufficient documentation

## 2017-04-06 DIAGNOSIS — M170 Bilateral primary osteoarthritis of knee: Secondary | ICD-10-CM

## 2017-04-06 DIAGNOSIS — E785 Hyperlipidemia, unspecified: Secondary | ICD-10-CM | POA: Diagnosis not present

## 2017-04-06 DIAGNOSIS — I10 Essential (primary) hypertension: Secondary | ICD-10-CM | POA: Diagnosis not present

## 2017-04-06 LAB — CBC WITH DIFFERENTIAL/PLATELET
BASOS: 0 %
Basophils Absolute: 0 10*3/uL (ref 0.0–0.2)
EOS (ABSOLUTE): 0.1 10*3/uL (ref 0.0–0.4)
EOS: 1 %
HEMATOCRIT: 43.9 % (ref 34.0–46.6)
Hemoglobin: 15.4 g/dL (ref 11.1–15.9)
IMMATURE GRANULOCYTES: 0 %
Immature Grans (Abs): 0 10*3/uL (ref 0.0–0.1)
LYMPHS ABS: 2.4 10*3/uL (ref 0.7–3.1)
Lymphs: 31 %
MCH: 34.4 pg — AB (ref 26.6–33.0)
MCHC: 35.1 g/dL (ref 31.5–35.7)
MCV: 98 fL — AB (ref 79–97)
MONOS ABS: 0.6 10*3/uL (ref 0.1–0.9)
Monocytes: 8 %
NEUTROS PCT: 60 %
Neutrophils Absolute: 4.6 10*3/uL (ref 1.4–7.0)
PLATELETS: 195 10*3/uL (ref 150–379)
RBC: 4.48 x10E6/uL (ref 3.77–5.28)
RDW: 13 % (ref 12.3–15.4)
WBC: 7.7 10*3/uL (ref 3.4–10.8)

## 2017-04-06 LAB — COMPREHENSIVE METABOLIC PANEL
A/G RATIO: 1.6 (ref 1.2–2.2)
ALK PHOS: 85 IU/L (ref 39–117)
ALT: 13 IU/L (ref 0–32)
AST: 13 IU/L (ref 0–40)
Albumin: 4.2 g/dL (ref 3.5–5.5)
BILIRUBIN TOTAL: 0.3 mg/dL (ref 0.0–1.2)
BUN/Creatinine Ratio: 19 (ref 9–23)
BUN: 14 mg/dL (ref 6–24)
CALCIUM: 9.4 mg/dL (ref 8.7–10.2)
CHLORIDE: 100 mmol/L (ref 96–106)
CO2: 24 mmol/L (ref 18–29)
Creatinine, Ser: 0.74 mg/dL (ref 0.57–1.00)
GFR calc Af Amer: 108 mL/min/{1.73_m2} (ref >59)
GFR, EST NON AFRICAN AMERICAN: 93 mL/min/{1.73_m2} (ref >59)
GLOBULIN, TOTAL: 2.7 g/dL (ref 1.5–4.5)
Glucose: 107 mg/dL — ABNORMAL HIGH (ref 65–99)
POTASSIUM: 3.7 mmol/L (ref 3.5–5.2)
SODIUM: 141 mmol/L (ref 134–144)
Total Protein: 6.9 g/dL (ref 6.0–8.5)

## 2017-04-06 LAB — LIPID PANEL
CHOL/HDL RATIO: 7.1 ratio — AB (ref 0.0–4.4)
Cholesterol, Total: 200 mg/dL — ABNORMAL HIGH (ref 100–199)
HDL: 28 mg/dL — AB (ref >39)
LDL Calculated: 137 mg/dL — ABNORMAL HIGH (ref 0–99)
TRIGLYCERIDES: 177 mg/dL — AB (ref 0–149)
VLDL Cholesterol Cal: 35 mg/dL (ref 5–40)

## 2017-04-06 LAB — HEMOGLOBIN A1C
ESTIMATED AVERAGE GLUCOSE: 117 mg/dL
Hgb A1c MFr Bld: 5.7 % — ABNORMAL HIGH (ref 4.8–5.6)

## 2017-04-06 MED ORDER — ALPRAZOLAM 1 MG PO TABS: 0.5000 mg | ORAL_TABLET | Freq: Three times a day (TID) | ORAL | 0 refills | Status: DC | PRN

## 2017-04-06 MED ORDER — CITALOPRAM HYDROBROMIDE 20 MG PO TABS: 40.0000 mg | ORAL_TABLET | Freq: Every morning | ORAL | 3 refills | Status: DC

## 2017-04-06 MED ORDER — ATENOLOL 50 MG PO TABS: 50.0000 mg | ORAL_TABLET | Freq: Every morning | ORAL | 3 refills | Status: AC

## 2017-04-06 MED ORDER — LISINOPRIL 5 MG PO TABS: 5.0000 mg | ORAL_TABLET | Freq: Every day | ORAL | 3 refills | Status: DC

## 2017-04-06 NOTE — Assessment & Plan Note (Addendum)
Controlled substance contract signed today. [place] site is down. Risk tool completed: low risk. Needs UDS next visit.

## 2017-04-06 NOTE — Assessment & Plan Note (Signed)
Update CMET and A1C today.

## 2017-04-06 NOTE — Assessment & Plan Note (Signed)
Stable on citalopram 40 mg.

## 2017-04-06 NOTE — Assessment & Plan Note (Signed)
Refer back to orthopedics. She may benefit from repeat injections. Opiate not refilled today.

## 2017-04-06 NOTE — Assessment & Plan Note (Signed)
Continue alprazolam. If need escalates, will likely recommend psychiatry evaluation.

## 2017-04-06 NOTE — Assessment & Plan Note (Signed)
In contemplative stage. Encouraged additional consideration.

## 2017-04-06 NOTE — Patient Instructions (Addendum)
Please add the lisinopril (a lower dose, without the HCTZ). We will recheck your blood pressure in 6 weeks. If you check your blood pressure in the meantime, please record it, and bring the record in with you to your next visit.  We recommend that you schedule a mammogram for breast cancer screening. Typically, you do not need a referral to do this. Please contact a local imaging center to schedule your mammogram.  [facility] - [phone]  *ask for the Radiology Department [facility]) - [phone] or [phone]  [facility] - [phone] [facility] - [phone] [facility] - [phone]  *ask for the Radiology Department [facility] - [phone]  *ask for the Radiology Department [facility] - [phone]  *ask for the Mammography Department [facility] - [phone]  IF you received an x-ray today, you will receive an invoice from [facility]. Please contact [facility] at [phone] with questions or concerns regarding your invoice.   IF you received labwork today, you will receive an invoice from [facility]. Please contact [facility] at [phone] with questions or concerns regarding your invoice.   Our billing staff will not be able to assist you with questions regarding bills from these companies.  You will be contacted with the lab results as soon as they are available. The fastest way to get your results is to activate your My Chart account. Instructions are located on the last page of this paperwork. If you have not heard from [place] regarding the results in 2 weeks, please contact this office.     Did you know that you begin to benefit from quitting smoking within the first twenty minutes? It's TRUE.  At 20 minutes: -blood pressure decreases -pulse rate drops -body temperature of hands and feet increases  At 8 hours: -carbon  monoxide level in blood drops to normal -oxygen level in blood increases to normal  At 24 hours: -the chance of heart attack decreases  At 48 hours: -nerve endings start regrowing -ability to smell and taste is enhanced  2 weeks-3 months: -circulation improves -walking becomes easier -lung function improves  1-9 months: -coughing, sinus congestion, fatigue and shortness of breath decreases  1 year: -excess risk of heart disease is decreased to HALF that of a smoker  5 years: Stroke risk is reduced to that of people who have never smoked  10 years: -risk of lung cancer drops to as little as half that of continuing smokers -risk of cancer of the mouth, throat, esophagus, bladder, kidney and pancreas decreases -risk of ulcer decreases  15 years -risk of heart disease is now similar to that of people who have never smoked -risk of death returns to nearly the level of people who have never smoked

## 2017-04-06 NOTE — Assessment & Plan Note (Signed)
Update lipid profile today. Given previous results, anticipate recommending statin.
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
## 2017-04-06 NOTE — Assessment & Plan Note (Signed)
Recommend she update her mammogram.

## 2017-04-06 NOTE — Assessment & Plan Note (Signed)
Continue atenolol. Restart lisinopril 5 mg. Home log. Recheck in 6 weeks.

## 2017-04-07 ENCOUNTER — Encounter: Payer: Self-pay | Admitting: Physician Assistant

## 2017-05-18 ENCOUNTER — Ambulatory Visit: Payer: PRIVATE HEALTH INSURANCE | Admitting: Physician Assistant

## 2017-05-30 ENCOUNTER — Emergency Department (HOSPITAL_COMMUNITY)
Admission: EM | Admit: 2017-05-30 | Discharge: 2017-05-31 | Disposition: A | Discharge status: Home / Self Care | Payer: PRIVATE HEALTH INSURANCE | Attending: Emergency Medicine | Admitting: Emergency Medicine

## 2017-05-30 ENCOUNTER — Encounter (HOSPITAL_COMMUNITY): Payer: Self-pay | Admitting: Emergency Medicine

## 2017-05-30 DIAGNOSIS — Z79899 Other long term (current) drug therapy: Secondary | ICD-10-CM | POA: Diagnosis not present

## 2017-05-30 DIAGNOSIS — E860 Dehydration: Secondary | ICD-10-CM | POA: Insufficient documentation

## 2017-05-30 DIAGNOSIS — I10 Essential (primary) hypertension: Secondary | ICD-10-CM | POA: Insufficient documentation

## 2017-05-30 DIAGNOSIS — R51 Headache: Secondary | ICD-10-CM | POA: Diagnosis present

## 2017-05-30 DIAGNOSIS — F17210 Nicotine dependence, cigarettes, uncomplicated: Secondary | ICD-10-CM | POA: Diagnosis not present

## 2017-05-30 DIAGNOSIS — R519 Headache, unspecified: Secondary | ICD-10-CM

## 2017-05-30 LAB — URINALYSIS, ROUTINE W REFLEX MICROSCOPIC
BILIRUBIN URINE: NEGATIVE
Glucose, UA: NEGATIVE mg/dL
HGB URINE DIPSTICK: NEGATIVE
KETONES UR: NEGATIVE mg/dL
LEUKOCYTES UA: NEGATIVE
NITRITE: NEGATIVE
Protein, ur: NEGATIVE mg/dL
Specific Gravity, Urine: 1.008 (ref 1.005–1.030)
pH: 7 (ref 5.0–8.0)

## 2017-05-30 MED ORDER — MORPHINE SULFATE (PF) 4 MG/ML IV SOLN
4.0000 mg | Freq: Once | INTRAVENOUS | Status: AC
  Administered 2017-05-30: 4 mg via INTRAVENOUS
  Filled 2017-05-30: qty 1

## 2017-05-30 MED ORDER — SODIUM CHLORIDE 0.9 % IV BOLUS (SEPSIS)
1000.0000 mL | Freq: Once | INTRAVENOUS | Status: AC
  Administered 2017-05-30: 1000 mL via INTRAVENOUS

## 2017-05-30 MED ORDER — METOCLOPRAMIDE HCL 5 MG/ML IJ SOLN
10.0000 mg | Freq: Once | INTRAMUSCULAR | Status: AC
  Administered 2017-05-30: 10 mg via INTRAVENOUS
  Filled 2017-05-30: qty 2

## 2017-05-30 NOTE — ED Provider Notes (Signed)
[facility]-EMERGENCY DEPT Provider Note   CSN: [id number] Arrival date & time: 05/30/17  2213  By signing my name below, I, Diona BrownerJennifer Gorman, attest that this documentation has been prepared under the direction and in the presence of Azalia Bilisampos, Lelani Garnett, MD. Electronically Signed: Diona BrownerJennifer Gorman, ED Scribe. 05/30/17. 11:26 PM.  History   Chief Complaint Chief Complaint  Patient presents with  . Hypertension  . Nausea  . Migraine     HPI [name] is a 51 y.o. female who presents to the Emergency Department complaining of a HA that started 3-4 days ago. Associated sx include HTN, nausea, dry heaving, diaphoresis, tingling, dizziness, weakness, and appetite change. She has tried tylenol, extra strength tylenol, Excedrin migraine with no relief. Went to her PCP ~ a month ago and was put back on blood pressure medication, but it hasn't lowered her BP. She is trying to quit smoking. Pt denies fever, dysuria, and hematuria.  The history is provided by the patient. No language interpreter was used.    Past Medical History:  Diagnosis Date  . Arthritis    knees, R>L  . Depression   . Dysrhythmia    rare pvc   . Gallstones   . GERD (gastroesophageal reflux disease)   . Hypertension   . Panic disorder     Patient Active Problem List   Diagnosis Date Noted  . Generalized anxiety disorder 04/06/2017  . Chronic prescription benzodiazepine use 04/06/2017  . Prediabetes 09/12/2013  . Arthritis of both knees 09/08/2013  . Cholelithiasis with cholecystitis 03/13/2013  . Choledocholithiasis, status post ERCP 03/13/2013  . CERUMEN IMPACTION, RIGHT 10/28/2010  . LUMBAGO 07/31/2010  . GANGLION CYST, WRIST, RIGHT 07/31/2010  . CANDIDIASIS, VAGINAL 02/07/2009  . TMJ SYNDROME 02/07/2009  . PREMATURE VENTRICULAR CONTRACTIONS 12/10/2008  . PANIC DISORDER 10/17/2008  . Smoker 10/17/2008  . Hyperlipidemia 07/17/2008  . GERD 03/19/2008  . BREAST MASS, RIGHT 12/07/2006  . HYPERTENSION, BENIGN  ESSENTIAL 12/07/2005    Past Surgical History:  Procedure Laterality Date  . ABDOMINAL HYSTERECTOMY    . CHOLECYSTECTOMY N/A 03/14/2013   Procedure: LAPAROSCOPIC CHOLECYSTECTOMY WITH INTRAOPERATIVE CHOLANGIOGRAM;  Surgeon: Velora Hecklerodd M Gerkin, MD;  Location: [facility] ORS;  Service: General;  Laterality: N/A;  . ERCP N/A 03/03/2013   Procedure: ENDOSCOPIC RETROGRADE CHOLANGIOPANCREATOGRAPHY (ERCP);  Surgeon: Theda BelfastPatrick D Hung, MD;  [facility];  Service: Endoscopy;  Laterality: N/A;  . EUS N/A 03/03/2013   Procedure: UPPER ENDOSCOPIC ULTRASOUND (EUS) LINEAR;  Surgeon: Theda BelfastPatrick D Hung, MD;  Location: [facility];  Service: Endoscopy;  Laterality: N/A;    OB History    No data available       Home Medications    Prior to Admission medications   Medication Sig Start Date End Date Taking? Authorizing Provider  acetaminophen (TYLENOL) 500 MG tablet Take 1,000 mg by mouth every 6 (six) hours as needed for mild pain, moderate pain or headache.    [provider]  ALPRAZolam Prudy Feeler(XANAX) 1 MG tablet Take 0.5 tablets (0.5 mg total) by mouth 3 (three) times daily as needed for anxiety or sleep. 04/06/17   [name], Chelle, PA-C  ALPRAZolam (XANAX) 1 MG tablet Take 0.5-1 tablets (0.5-1 mg total) by mouth 3 (three) times daily as needed for anxiety. 04/06/17   [name], Chelle, PA-C  ALPRAZolam (XANAX) 1 MG tablet Take 0.5-1 tablets (0.5-1 mg total) by mouth 3 (three) times daily as needed for anxiety. 04/06/17   [name], Chelle, PA-C  atenolol (TENORMIN) 50 MG tablet Take 1 tablet (50  mg total) by mouth every morning. 04/06/17   Porfirio Oar, PA-C  citalopram (CELEXA) 20 MG tablet Take 2 tablets (40 mg total) by mouth every morning. 04/06/17   Porfirio Oar, PA-C  HYDROcodone-acetaminophen (NORCO/VICODIN) 5-325 MG per tablet Take 1 tablet by mouth every 4 (four) hours as needed. 08/16/15   Garlon Hatchet, PA-C  ibuprofen (ADVIL,MOTRIN) 200 MG tablet Take 400-600 mg by mouth every 6 (six) hours as needed for  pain.    [provider]  lisinopril (PRINIVIL,ZESTRIL) 5 MG tablet Take 1 tablet (5 mg total) by mouth daily. 04/06/17   Porfirio Oar, PA-C  omeprazole (PRILOSEC) 20 MG capsule Take 2 capsules (40 mg total) by mouth daily. Patient not taking: Reported on 12/29/2016 08/23/13   Maretta Bees, MD    Family History Family History  Problem Relation Age of Onset  . Dementia Father   . Heart disease Father   . Arthritis Father   . Hypertension Maternal Aunt   . Arthritis Sister   . Heart disease Sister     Social History Social History  Substance Use Topics  . Smoking status: Current Every Day Smoker    Packs/day: 1.50    Years: 30.00    Types: Cigarettes  . Smokeless tobacco: Never Used  . Alcohol use No     Allergies   Benadryl [diphenhydramine]   Review of Systems Review of Systems  A complete 10 system review of systems was obtained and all systems are negative except as noted in the HPI and PMH.   Physical Exam Updated Vital Signs BP (!) 172/109 (BP Location: Right Arm)   Pulse 78   Temp 98.1 F (36.7 C) (Oral)   Resp 18   Ht 5\' 8"  (1.727 m)   Wt 224 lb (101.6 kg)   SpO2 99%   BMI 34.06 kg/m   Physical Exam  Constitutional: She is oriented to person, place, and time. She appears well-developed and well-nourished. No distress.  HENT:  Head: Normocephalic and atraumatic.  Eyes: EOM are normal. Pupils are equal, round, and reactive to light.  Neck: Normal range of motion.  Cardiovascular: Normal rate, regular rhythm and normal heart sounds.   Pulmonary/Chest: Effort normal and breath sounds normal.  Abdominal: Soft. She exhibits no distension. There is no tenderness.  Musculoskeletal: Normal range of motion.  Neurological: She is alert and oriented to person, place, and time.  5/5 strength in major muscle groups of  bilateral upper and lower extremities. Speech normal. No facial asymetry.   Skin: Skin is warm and dry.  Psychiatric: She has a  normal mood and affect. Judgment normal.  Nursing note and vitals reviewed.    ED Treatments / Results  DIAGNOSTIC STUDIES: Oxygen Saturation is 99% on RA, normal by my interpretation.   COORDINATION OF CARE: 11:26 PM-Discussed next steps with pt which includes a CT scan. Pt verbalized understanding and is agreeable with the plan.   Labs (all labs ordered are listed, but only abnormal results are displayed) Labs Reviewed  URINALYSIS, ROUTINE W REFLEX MICROSCOPIC - Abnormal; Notable for the following:       Result Value   Bacteria, UA FEW (*)    Squamous Epithelial / LPF 0-5 (*)    All other components within normal limits  I-STAT CHEM 8, ED - Abnormal; Notable for the following:    Potassium 3.3 (*)    Calcium, Ion 1.12 (*)    Hemoglobin 16.3 (*)    HCT 48.0 (*)  All other components within normal limits    EKG  EKG Interpretation None       Radiology Ct Head Wo Contrast  Result Date: 05/31/2017 CLINICAL DATA:  Headache for several days. Hypertension for 1 month. EXAM: CT HEAD WITHOUT CONTRAST TECHNIQUE: Contiguous axial images were obtained from the base of the skull through the vertex without intravenous contrast. COMPARISON:  None. FINDINGS: Brain: There is no intracranial hemorrhage, mass or evidence of acute infarction. There is no extra-axial fluid collection. Gray matter and white matter appear normal. Cerebral volume is normal for age. Brainstem and posterior fossa are unremarkable. The CSF spaces appear normal. Vascular: No hyperdense vessel or unexpected calcification. Skull: Normal. Negative for fracture or focal lesion. Sinuses/Orbits: No acute finding. Other: None. IMPRESSION: Normal brain Electronically Signed   By: Ellery Plunk M.D.   On: 05/31/2017 00:28    Procedures Procedures (including critical care time)  Medications Ordered in ED Medications  sodium chloride 0.9 % bolus 1,000 mL (1,000 mLs Intravenous New Bag/Given 05/30/17 2351)  morphine  4 MG/ML injection 4 mg (4 mg Intravenous Given 05/30/17 2352)  metoCLOPramide (REGLAN) injection 10 mg (10 mg Intravenous Given 05/30/17 2351)     Initial Impression / Assessment and Plan / ED Course  I have reviewed the triage vital signs and the nursing notes.  Pertinent labs & imaging results that were available during my care of the patient were reviewed by me and considered in my medical decision making (see chart for details).    I personally reviewed the imaging tests through PACS system I reviewed available ER/hospitalization records through the EMR   12:58 AM Patient with some improvement in her symptoms.  Labs normal.  Head CT normal.  Blood pressure 147/83.  Primary care follow-up.  No indication for additional workup or treatment in the ER tonight.   Final Clinical Impressions(s) / ED Diagnoses   Final diagnoses:  Acute nonintractable headache, unspecified headache type  Dehydration    New Prescriptions New Prescriptions   No medications on file   I personally performed the services described in this documentation, which was scribed in my presence. The recorded information has been reviewed and is accurate.       Azalia Bilis, MD 05/31/17 [phone]

## 2017-05-30 NOTE — ED Triage Notes (Signed)
Patient started having headache, high blood pressure, and tingling all over. Patient started having this three days ago. Patient is nauseated.

## 2017-05-31 ENCOUNTER — Encounter: Payer: Self-pay | Admitting: Physician Assistant

## 2017-05-31 ENCOUNTER — Ambulatory Visit (INDEPENDENT_AMBULATORY_CARE_PROVIDER_SITE_OTHER): Payer: PRIVATE HEALTH INSURANCE | Admitting: Physician Assistant

## 2017-05-31 ENCOUNTER — Emergency Department (HOSPITAL_COMMUNITY): Payer: PRIVATE HEALTH INSURANCE

## 2017-05-31 VITALS — BP 140/80 | HR 69 | Temp 98.0°F | Resp 18 | Ht 68.0 in | Wt 218.4 lb

## 2017-05-31 DIAGNOSIS — R51 Headache: Secondary | ICD-10-CM | POA: Diagnosis not present

## 2017-05-31 DIAGNOSIS — R519 Headache, unspecified: Secondary | ICD-10-CM

## 2017-05-31 DIAGNOSIS — I10 Essential (primary) hypertension: Secondary | ICD-10-CM

## 2017-05-31 LAB — I-STAT CHEM 8, ED
BUN: 10 mg/dL (ref 6–20)
Calcium, Ion: 1.12 mmol/L — ABNORMAL LOW (ref 1.15–1.40)
Chloride: 103 mmol/L (ref 101–111)
Creatinine, Ser: 0.6 mg/dL (ref 0.44–1.00)
GLUCOSE: 96 mg/dL (ref 65–99)
HCT: 48 % — ABNORMAL HIGH (ref 36.0–46.0)
HEMOGLOBIN: 16.3 g/dL — AB (ref 12.0–15.0)
POTASSIUM: 3.3 mmol/L — AB (ref 3.5–5.1)
Sodium: 141 mmol/L (ref 135–145)
TCO2: 26 mmol/L (ref 0–100)

## 2017-05-31 MED ORDER — AMLODIPINE BESYLATE 5 MG PO TABS: 5.0000 mg | ORAL_TABLET | Freq: Every day | ORAL | 3 refills | Status: DC

## 2017-05-31 NOTE — Progress Notes (Signed)
Patient ID: [name], female    DOB: [birth year], 51 y.o.   MRN: [id number]  PCP: [name], Yvanna Vidas, PA-C  Chief Complaint  Patient presents with  . Hypertension    BP was 172/109 at the hospital, Current 140/80, per PT still has headache   . Hospitalization Follow-up    05/31/2017    Subjective:   Presents for evaluation of HTN.  She was seen here 04/06/2017 with elevated BP, on atenolol. She had previously also been treated with lisinoprilHCTZ, which was stopped, and home BP readings 120's/80's. BP was elevated, so lisinopril was added to her regimen. She was to return in 6 weeks.  Home BP readings reportedly 150's/90's since then.  Yesterday she presented to the ED with a terrible headache. BP was 172/109.  BP and headache improved with morphine. Neurologic exam was normal. Head CT was normal.. "I've always had headaches. Maybe more than most people." This one began 4-5 days ago. Yesterday she had some associated tingling in her upper extremities, but no CP, SOB, dizziness, nausea, vomiting, facial droop, slurred speech, weakness or visual changes.   Review of Systems As above.    Patient Active Problem List   Diagnosis Date Noted  . Generalized anxiety disorder 04/06/2017  . Chronic prescription benzodiazepine use 04/06/2017  . Prediabetes 09/12/2013  . Arthritis of both knees 09/08/2013  . LUMBAGO 07/31/2010  . GANGLION CYST, WRIST, RIGHT 07/31/2010  . TMJ SYNDROME 02/07/2009  . PREMATURE VENTRICULAR CONTRACTIONS 12/10/2008  . PANIC DISORDER 10/17/2008  . Smoker 10/17/2008  . Hyperlipidemia 07/17/2008  . GERD 03/19/2008  . BREAST MASS, RIGHT 12/07/2006  . HYPERTENSION, BENIGN ESSENTIAL 12/07/2005     Prior to Admission medications   Medication Sig Start Date End Date Taking? Authorizing Provider  acetaminophen (TYLENOL) 500 MG tablet Take 1,000 mg by mouth every 6 (six) hours as needed for mild pain, moderate pain or headache.   Yes [provider]  ALPRAZolam Prudy Feeler(XANAX) 1 MG tablet Take 0.5 tablets (0.5 mg total) by mouth 3 (three) times daily as needed for anxiety or sleep. 04/06/17  Yes Batina Dougan, PA-C  ALPRAZolam (XANAX) 1 MG tablet Take 0.5-1 tablets (0.5-1 mg total) by mouth 3 (three) times daily as needed for anxiety. 04/06/17  Yes Masin Shatto, PA-C  atenolol (TENORMIN) 50 MG tablet Take 1 tablet (50 mg total) by mouth every morning. 04/06/17  Yes Arlesia Kiel, PA-C  citalopram (CELEXA) 20 MG tablet Take 2 tablets (40 mg total) by mouth every morning. 04/06/17  Yes Xzandria Clevinger, PA-C  lisinopril (PRINIVIL,ZESTRIL) 5 MG tablet Take 1 tablet (5 mg total) by mouth daily. 04/06/17  Yes Samanth Mirkin, PA-C  omeprazole (PRILOSEC) 20 MG capsule Take 2 capsules (40 mg total) by mouth daily. 08/23/13  Yes Ghimire, Werner LeanShanker M, MD  ALPRAZolam Prudy Feeler(XANAX) 1 MG tablet Take 0.5-1 tablets (0.5-1 mg total) by mouth 3 (three) times daily as needed for anxiety. 04/06/17   [name], [name], PA-C  ibuprofen (ADVIL,MOTRIN) 200 MG tablet Take 400-600 mg by mouth every 6 (six) hours as needed for pain.    [provider]     Allergies  Allergen Reactions  . Benadryl [Diphenhydramine]     Throat closes up       Objective:  Physical Exam  Constitutional: She is oriented to person, place, and time. She appears well-developed and well-nourished. She is active and cooperative. No distress.  BP 140/80 (BP Location: Left Arm, Patient Position: Sitting, Cuff Size: Large)  Pulse 69   Temp 98 F (36.7 C) (Oral)   Resp 18   Ht 5\' 8"  (1.727 m)   Wt 218 lb 6.4 oz (99.1 kg)   SpO2 98%   BMI 33.21 kg/m   HENT:  Head: Normocephalic and atraumatic.  Right Ear: Hearing normal.  Left Ear: Hearing normal.  Eyes: Conjunctivae are normal. No scleral icterus.  Neck: Normal range of motion. Neck supple. No thyromegaly present.  Cardiovascular: Normal rate, regular rhythm and normal heart sounds.   Pulses:      Radial pulses are 2+ on the  right side, and 2+ on the left side.  Pulmonary/Chest: Effort normal and breath sounds normal.  Lymphadenopathy:       Head (right side): No tonsillar, no preauricular, no posterior auricular and no occipital adenopathy present.       Head (left side): No tonsillar, no preauricular, no posterior auricular and no occipital adenopathy present.    She has no cervical adenopathy.       Right: No supraclavicular adenopathy present.       Left: No supraclavicular adenopathy present.  Neurological: She is alert and oriented to person, place, and time. She displays normal reflexes. No cranial nerve deficit or sensory deficit. She exhibits normal muscle tone. Coordination normal.  Skin: Skin is warm, dry and intact. No rash noted. No cyanosis or erythema. Nails show no clubbing.  Psychiatric: She has a normal mood and affect. Her speech is normal and behavior is normal.           Assessment & Plan:   Problem List Items Addressed This Visit    HYPERTENSION, BENIGN ESSENTIAL - Primary    Uncontrolled. Add amlodipine, in hopes of also reducing HA. Consider increasing dose and D/C lisinopril next visit if helpful.      Relevant Medications   amLODipine (NORVASC) 5 MG tablet    Other Visit Diagnoses    Nonintractable headache, unspecified chronicity pattern, unspecified headache type       expect improvement with improved BP. May see improvement with CCB.   Relevant Medications   amLODipine (NORVASC) 5 MG tablet       Return in about 4 weeks (around 06/28/2017), or if symptoms worsen or fail to improve, for re-evaluation of blood pressure.   Fernande Bras, PA-C Primary Care at [facility]

## 2017-05-31 NOTE — Patient Instructions (Addendum)
1. CONTINUE the lisinopril. 2. START the amlodipine. 3. Continue checking your blood pressure. Let me know what it's running in 1-2 weeks. We can make an adjustment over the phone to save you a visit to the office.  We recommend that you schedule a mammogram for breast cancer screening. Typically, you do not need a referral to do this. Please contact a local imaging center to schedule your mammogram.  [facility] - [phone]  *ask for the Radiology Department [facility]) - [phone] or [phone]  [facility] - [phone] [facility] - [phone] [facility] - [phone]) [phone]  *ask for the Radiology Department [facility] - [phone]) [phone]  *ask for the Radiology Department [facility] - [phone]  *ask for the Mammography Department [facility] - [phone]) [phone]  IF you received an x-ray today, you will receive an invoice from [facility]. Please contact [facility] at [phone] with questions or concerns regarding your invoice.   IF you received labwork today, you will receive an invoice from [facility]. Please contact [facility] at [phone] with questions or concerns regarding your invoice.   Our billing staff will not be able to assist you with questions regarding bills from these companies.  You will be contacted with the lab results as soon as they are available. The fastest way to get your results is to activate your My Chart account. Instructions are located on the last page of this paperwork. If you have not heard from us regarding the results in 2 weeks, please contact this office.

## 2017-05-31 NOTE — Assessment & Plan Note (Signed)
Uncontrolled. Add amlodipine, in hopes of also reducing HA. Consider increasing dose and D/C lisinopril next visit if helpful.

## 2017-06-01 MED ORDER — LISINOPRIL-HYDROCHLOROTHIAZIDE 10-12.5 MG PO TABS: 1.0000 | ORAL_TABLET | Freq: Every day | ORAL | 3 refills | Status: AC

## 2017-06-10 ENCOUNTER — Encounter: Payer: Self-pay | Admitting: Physician Assistant

## 2017-06-29 ENCOUNTER — Ambulatory Visit: Payer: PRIVATE HEALTH INSURANCE | Admitting: Physician Assistant

## 2017-08-27 ENCOUNTER — Ambulatory Visit (INDEPENDENT_AMBULATORY_CARE_PROVIDER_SITE_OTHER): Payer: PRIVATE HEALTH INSURANCE | Admitting: Physician Assistant

## 2017-08-27 ENCOUNTER — Encounter: Payer: Self-pay | Admitting: Physician Assistant

## 2017-08-27 VITALS — BP 127/82 | HR 71 | Temp 97.9°F | Resp 18 | Ht 68.0 in | Wt 207.8 lb

## 2017-08-27 DIAGNOSIS — F410 Panic disorder [episodic paroxysmal anxiety] without agoraphobia: Secondary | ICD-10-CM | POA: Diagnosis not present

## 2017-08-27 DIAGNOSIS — Z1211 Encounter for screening for malignant neoplasm of colon: Secondary | ICD-10-CM

## 2017-08-27 DIAGNOSIS — Z1231 Encounter for screening mammogram for malignant neoplasm of breast: Secondary | ICD-10-CM | POA: Diagnosis not present

## 2017-08-27 DIAGNOSIS — Z79899 Other long term (current) drug therapy: Secondary | ICD-10-CM | POA: Diagnosis not present

## 2017-08-27 DIAGNOSIS — R519 Headache, unspecified: Secondary | ICD-10-CM

## 2017-08-27 DIAGNOSIS — F411 Generalized anxiety disorder: Secondary | ICD-10-CM

## 2017-08-27 DIAGNOSIS — I10 Essential (primary) hypertension: Secondary | ICD-10-CM | POA: Diagnosis not present

## 2017-08-27 DIAGNOSIS — R51 Headache: Secondary | ICD-10-CM

## 2017-08-27 DIAGNOSIS — F17200 Nicotine dependence, unspecified, uncomplicated: Secondary | ICD-10-CM | POA: Diagnosis not present

## 2017-08-27 MED ORDER — ALPRAZOLAM 1 MG PO TABS: 0.5000 mg | ORAL_TABLET | Freq: Three times a day (TID) | ORAL | 0 refills | Status: DC | PRN

## 2017-08-27 NOTE — Patient Instructions (Addendum)
     IF you received an x-ray today, you will receive an invoice from [facility]. Please contact [facility] at [phone] with questions or concerns regarding your invoice.   IF you received labwork today, you will receive an invoice from [facility]. Please contact [facility] at [phone] with questions or concerns regarding your invoice.   Our billing staff will not be able to assist you with questions regarding bills from these companies.  You will be contacted with the lab results as soon as they are available. The fastest way to get your results is to activate your My Chart account. Instructions are located on the last page of this paperwork. If you have not heard from us regarding the results in 2 weeks, please contact this office.    Did you know that you begin to benefit from quitting smoking within the first twenty minutes? It's TRUE.  At 20 minutes: -blood pressure decreases -pulse rate drops -body temperature of hands and feet increases  At 8 hours: -carbon monoxide level in blood drops to normal -oxygen level in blood increases to normal  At 24 hours: -the chance of heart attack decreases  At 48 hours: -nerve endings start regrowing -ability to smell and taste is enhanced  2 weeks-3 months: -circulation improves -walking becomes easier -lung function improves  1-9 months: -coughing, sinus congestion, fatigue and shortness of breath decreases  1 year: -excess risk of heart disease is decreased to HALF that of a smoker  5 years: Stroke risk is reduced to that of people who have never smoked  10 years: -risk of lung cancer drops to as little as half that of continuing smokers -risk of cancer of the mouth, throat, esophagus, bladder, kidney and pancreas decreases -risk of ulcer decreases  15 years -risk of heart disease is now similar to that of people who have never smoked -risk of death returns to nearly the level of people who have  never smoked   

## 2017-08-27 NOTE — Progress Notes (Signed)
Patient ID: [name], female    DOB: May 29, 1966, 51 y.o.   MRN: [id number]  PCP: Porfirio Oar, PA-C  Chief Complaint  Patient presents with  . Medication Management  . Follow-up    Subjective:   Presents for evaluation of HTN, anxiety.  Left her job. After 12 years, just couldn't take it any more. Now stressed that she's unemployed. Isn't sleeping well. Worried about finding another job.  RIGHT knee pain prevents her from doing much exercise. Sometimes has pain in the thigh or lower leg.  Has had some weight loss. Reduced appetite, though sometimes "I pig out." No melena or hematochezia. Some diarrhea associated with stress. No SOB or increased cough.  Maybe a little improvement in HA with the addition of amlodipine. Is taking lisinoprilHCT QOD due to low pressures some days.  Review of Systems No CP, SOB. HA. Loss of appetite, weight loss. Knee pain.    Patient Active Problem List   Diagnosis Date Noted  . Generalized anxiety disorder 04/06/2017  . Chronic prescription benzodiazepine use 04/06/2017  . Prediabetes 09/12/2013  . Arthritis of both knees 09/08/2013  . LUMBAGO 07/31/2010  . GANGLION CYST, WRIST, RIGHT 07/31/2010  . TMJ SYNDROME 02/07/2009  . PREMATURE VENTRICULAR CONTRACTIONS 12/10/2008  . PANIC DISORDER 10/17/2008  . Smoker 10/17/2008  . Hyperlipidemia 07/17/2008  . GERD 03/19/2008  . BREAST MASS, RIGHT 12/07/2006  . HYPERTENSION, BENIGN ESSENTIAL 12/07/2005     Prior to Admission medications   Medication Sig Start Date End Date Taking? Authorizing Provider  acetaminophen (TYLENOL) 500 MG tablet Take 1,000 mg by mouth every 6 (six) hours as needed for mild pain, moderate pain or headache.   Yes [provider]  ALPRAZolam Prudy Feeler) 1 MG tablet Take 0.5 tablets (0.5 mg total) by mouth 3 (three) times daily as needed for anxiety or sleep. 04/06/17  Yes Sahiti Joswick, PA-C  ALPRAZolam (XANAX) 1 MG tablet Take 0.5-1 tablets  (0.5-1 mg total) by mouth 3 (three) times daily as needed for anxiety. 04/06/17  Yes Arkin Imran, PA-C  ALPRAZolam (XANAX) 1 MG tablet Take 0.5-1 tablets (0.5-1 mg total) by mouth 3 (three) times daily as needed for anxiety. 04/06/17  Yes Haldon Carley, PA-C  atenolol (TENORMIN) 50 MG tablet Take 1 tablet (50 mg total) by mouth every morning. 04/06/17  Yes Shanti Agresti, PA-C  citalopram (CELEXA) 20 MG tablet Take 2 tablets (40 mg total) by mouth every morning. 04/06/17  Yes Keyana Guevara, PA-C  ibuprofen (ADVIL,MOTRIN) 200 MG tablet Take 400-600 mg by mouth every 6 (six) hours as needed for pain.   Yes [provider]  lisinopril-hydrochlorothiazide (PRINZIDE,ZESTORETIC) 10-12.5 MG tablet Take 1 tablet by mouth daily. 06/01/17  Yes Jerome Otter, PA-C  omeprazole (PRILOSEC) 20 MG capsule Take 2 capsules (40 mg total) by mouth daily. 08/23/13  Yes Ghimire, Werner Lean, MD     Allergies  Allergen Reactions  . Benadryl [Diphenhydramine]     Throat closes up       Objective:  Physical Exam  Constitutional: She is oriented to person, place, and time. She appears well-developed and well-nourished. She is active and cooperative. No distress.  BP 127/82   Pulse 71   Temp 97.9 F (36.6 C) (Oral)   Resp 18   Ht  (1.727 m)   Wt 207 lb 12.8 oz (94.3 kg)   SpO2 97%   BMI 31.60 kg/m   HENT:  Head: Normocephalic and atraumatic.  Right Ear: Hearing normal.  Left Ear: Hearing normal.  Eyes: Conjunctivae are normal. No scleral icterus.  Neck: Normal range of motion. Neck supple. No thyromegaly present.  Cardiovascular: Normal rate, regular rhythm and normal heart sounds.   Pulses:      Radial pulses are 2+ on the right side, and 2+ on the left side.  Pulmonary/Chest: Effort normal and breath sounds normal.  Lymphadenopathy:       Head (right side): No tonsillar, no preauricular, no posterior auricular and no occipital adenopathy present.       Head (left side): No tonsillar, no  preauricular, no posterior auricular and no occipital adenopathy present.    She has no cervical adenopathy.       Right: No supraclavicular adenopathy present.       Left: No supraclavicular adenopathy present.  Neurological: She is alert and oriented to person, place, and time. No sensory deficit.  Skin: Skin is warm, dry and intact. No rash noted. No cyanosis or erythema. Nails show no clubbing.  Psychiatric: She has a normal mood and affect. Her speech is normal and behavior is normal.       Wt Readings from Last 3 Encounters:  08/27/17 207 lb 12.8 oz (94.3 kg)  05/31/17 218 lb 6.4 oz (99.1 kg)  05/30/17 224 lb (101.6 kg)       Assessment & Plan:   Problem List Items Addressed This Visit    PANIC DISORDER    Reasonably well controlled. Anticipate improvement when she is employed again.      Relevant Medications   ALPRAZolam (XANAX) 1 MG tablet   ALPRAZolam (XANAX) 1 MG tablet   ALPRAZolam (XANAX) 1 MG tablet   Smoker    Encouraged smoking cessation.      HYPERTENSION, BENIGN ESSENTIAL - Primary    COntrolled.       Generalized anxiety disorder    Reasonable control. Anticipate improvement with employment.      Relevant Medications   ALPRAZolam (XANAX) 1 MG tablet   ALPRAZolam (XANAX) 1 MG tablet   ALPRAZolam (XANAX) 1 MG tablet   Chronic prescription benzodiazepine use    Other Visit Diagnoses    Nonintractable headache, unspecified chronicity pattern, unspecified headache type       Anticipate improvement with reduced stress once she is working again. If persists, would recommend neurology evaluation.   Screening for colon cancer       Relevant Orders   Cologuard   Encounter for screening mammogram for breast cancer       Relevant Orders   MM DIGITAL SCREENING BILATERAL       Return in about 3 months (around 11/26/2017) for re-evaluation of blood pressure, headache, smoking, anxiety.   Fernande Bras, PA-C Primary Care at [facility]

## 2017-09-05 NOTE — Assessment & Plan Note (Signed)
Reasonably well controlled. Anticipate improvement when she is employed again.

## 2017-09-05 NOTE — Assessment & Plan Note (Signed)
COntrolled

## 2017-09-05 NOTE — Assessment & Plan Note (Signed)
Encouraged smoking cessation 

## 2017-09-05 NOTE — Assessment & Plan Note (Signed)
Reasonable control. Anticipate improvement with employment.

## 2017-11-26 ENCOUNTER — Ambulatory Visit: Payer: PRIVATE HEALTH INSURANCE | Admitting: Physician Assistant

## 2017-12-18 ENCOUNTER — Telehealth: Payer: Self-pay | Admitting: Physician Assistant

## 2017-12-18 NOTE — Telephone Encounter (Signed)
Pharmacy reports problem with alprazolam Rx. Patient given 3 rxs 08/27/2017, one of which has different instructions. Pharmacist wants to clarify.  Appears that same occurred with the Rxs written 04/26/2017, but no documentation of a call to clarify then.  Instructions on all three should have been 0.5-1 tablet. Pharmacist advised and will dispense with corrected instructions.

## 2018-01-12 ENCOUNTER — Telehealth: Payer: Self-pay | Admitting: Physician Assistant

## 2018-01-12 DIAGNOSIS — F411 Generalized anxiety disorder: Secondary | ICD-10-CM

## 2018-01-12 DIAGNOSIS — F410 Panic disorder [episodic paroxysmal anxiety] without agoraphobia: Secondary | ICD-10-CM

## 2018-01-12 NOTE — Telephone Encounter (Signed)
Copied from CRM [id number]. Topic: Quick Communication - Rx Refill/Question >> Jan 12, 2018  1:35 PM Maia Pettiesrtiz, Kristie S wrote: Medication: xanax - pt has 1/2 pill left - takes 1/2 pill 2-3/day - pt has appt 01/18/18 Has the patient contacted their pharmacy? No. Pt said has to pick up (Agent: If no, request that the patient contact the pharmacy for the refill.) Preferred Pharmacy (with phone number or street name): [facility] [address] [place], [place] - [address] [place] [address] Phone: [phone] Fax: [phone]   Agent: Please be advised that RX refills may take up to 3 business days. We ask that you follow-up with your pharmacy.

## 2018-01-13 NOTE — Telephone Encounter (Signed)
Pt requesting Xanax refill

## 2018-01-14 MED ORDER — ALPRAZOLAM 1 MG PO TABS: 0.5000 mg | ORAL_TABLET | Freq: Three times a day (TID) | ORAL | 0 refills | Status: DC | PRN

## 2018-01-14 MED ORDER — ALPRAZOLAM 1 MG PO TABS: 0.5000 mg | ORAL_TABLET | Freq: Three times a day (TID) | ORAL | 0 refills | Status: AC | PRN

## 2018-01-14 NOTE — Telephone Encounter (Signed)
Rx sent electronically.  Meds ordered this encounter  Medications  . ALPRAZolam (XANAX) 1 MG tablet    Sig: Take 0.5 tablets (0.5 mg total) by mouth 3 (three) times daily as needed for anxiety or sleep.    Dispense:  90 tablet    Refill:  0    May fill 60 days after date on prescription    Order Specific Question:   Supervising Provider    Answer:   Clelia CroftSHAW, EVA N [[id number]]  . ALPRAZolam (XANAX) 1 MG tablet    Sig: Take 0.5-1 tablets (0.5-1 mg total) by mouth 3 (three) times daily as needed for anxiety.    Dispense:  90 tablet    Refill:  0    Order Specific Question:   Supervising Provider    Answer:   SHAW, EVA N [[id number]]  . ALPRAZolam (XANAX) 1 MG tablet    Sig: Take 0.5-1 tablets (0.5-1 mg total) by mouth 3 (three) times daily as needed for anxiety.    Dispense:  90 tablet    Refill:  0    May fill 30 days after date on prescription    Order Specific Question:   Supervising Provider    Answer:   Sherren MochaSHAW, EVA N [phone][[id number]]   Patient has an appointment with me 11/17/2018.

## 2018-01-18 ENCOUNTER — Ambulatory Visit: Payer: Self-pay | Admitting: Physician Assistant

## 2018-01-18 ENCOUNTER — Encounter: Payer: Self-pay | Admitting: Physician Assistant

## 2018-01-18 ENCOUNTER — Other Ambulatory Visit: Payer: Self-pay

## 2018-01-18 VITALS — BP 138/82 | HR 83 | Temp 98.0°F | Resp 16 | Ht 69.29 in | Wt 217.0 lb

## 2018-01-18 DIAGNOSIS — F410 Panic disorder [episodic paroxysmal anxiety] without agoraphobia: Secondary | ICD-10-CM

## 2018-01-18 DIAGNOSIS — E785 Hyperlipidemia, unspecified: Secondary | ICD-10-CM

## 2018-01-18 DIAGNOSIS — I10 Essential (primary) hypertension: Secondary | ICD-10-CM

## 2018-01-18 DIAGNOSIS — R7303 Prediabetes: Secondary | ICD-10-CM

## 2018-01-18 MED ORDER — CITALOPRAM HYDROBROMIDE 40 MG PO TABS: 40.0000 mg | ORAL_TABLET | Freq: Every morning | ORAL | 3 refills | Status: AC

## 2018-01-18 NOTE — Assessment & Plan Note (Signed)
Unable to afford labs today. Healthy eating encouraged.

## 2018-01-18 NOTE — Assessment & Plan Note (Signed)
Unable to afford labs today. Healthy eating encouraged. 

## 2018-01-18 NOTE — Progress Notes (Signed)
Patient ID: [name], female    DOB: [birth year], 52 y.o.   MRN: [id number]  PCP: Porfirio Oar, PA-C  Chief Complaint  Patient presents with  . Chronic Conditions    3 month follow-up     Subjective:   Presents for evaluation of hypertension, hyperlipidemia, prediabetes, generalized anxiety disorder with panic.  Is re-employed, as a Lawyer. Now working nights. Still recovering financially for being out of work for several months. Car broke down yesterday. Does not have health insurance as part of her employment.  Sleeping better, maybe too much.  Continues to take lisinopril QOD. No longer has access to check her BP at work. Means to get her own meter, but can't afford it.  Feels like the citalopram 40 mg is working for her.   Health Maintenance Due  Topic Date Due  . MAMMOGRAM  09/03/2014  . COLONOSCOPY  09/03/2014  . TETANUS/TDAP  12/07/2017     Review of Systems  Constitutional: Positive for unexpected weight change. Negative for appetite change, chills and fever.  Eyes: Positive for visual disturbance (reading glasses).  Respiratory: Negative for shortness of breath.   Cardiovascular: Negative for chest pain.  Gastrointestinal: Negative for anal bleeding, blood in stool, constipation and diarrhea.  Musculoskeletal: Positive for back pain.  Psychiatric/Behavioral: Negative for decreased concentration and sleep disturbance.   Depression screen [facility] 2/9 01/18/2018 08/27/2017 05/31/2017 04/06/2017 12/29/2016  Decreased Interest 0 0 0 0 0  Down, Depressed, Hopeless 0 0 0 0 0  PHQ - 2 Score 0 0 0 0 0       Patient Active Problem List   Diagnosis Date Noted  . Generalized anxiety disorder 04/06/2017  . Chronic prescription benzodiazepine use 04/06/2017  . Prediabetes 09/12/2013  . Arthritis of both knees 09/08/2013  . LUMBAGO 07/31/2010  . GANGLION CYST, WRIST, RIGHT 07/31/2010  . TMJ SYNDROME 02/07/2009  . PREMATURE VENTRICULAR CONTRACTIONS 12/10/2008    . PANIC DISORDER 10/17/2008  . Smoker 10/17/2008  . Hyperlipidemia 07/17/2008  . GERD 03/19/2008  . BREAST MASS, RIGHT 12/07/2006  . HYPERTENSION, BENIGN ESSENTIAL 12/07/2005     Prior to Admission medications   Medication Sig Start Date End Date Taking? Authorizing Provider  acetaminophen (TYLENOL) 500 MG tablet Take 1,000 mg by mouth every 6 (six) hours as needed for mild pain, moderate pain or headache.   Yes [provider]  ALPRAZolam Prudy Feeler) 1 MG tablet Take 0.5-1 tablets (0.5-1 mg total) by mouth 3 (three) times daily as needed for anxiety. 01/14/18  Yes Shani Fitch, PA-C  atenolol (TENORMIN) 50 MG tablet Take 1 tablet (50 mg total) by mouth every morning. 04/06/17  Yes Chistina Roston, PA-C  citalopram (CELEXA) 20 MG tablet Take 2 tablets (40 mg total) by mouth every morning. 04/06/17  Yes Gibson Telleria, PA-C  ibuprofen (ADVIL,MOTRIN) 200 MG tablet Take 400-600 mg by mouth every 6 (six) hours as needed for pain.   Yes [provider]  lisinopril-hydrochlorothiazide (PRINZIDE,ZESTORETIC) 10-12.5 MG tablet Take 1 tablet by mouth daily. 06/01/17  Yes Boniface Goffe, PA-C  omeprazole (PRILOSEC) 20 MG capsule Take 2 capsules (40 mg total) by mouth daily. 08/23/13  Yes Ghimire, Werner Lean, MD     Allergies  Allergen Reactions  . Benadryl [Diphenhydramine]     Throat closes up       Objective:  Physical Exam  Constitutional: She is oriented to person, place, and time. She appears well-developed and well-nourished. She is active and cooperative. No distress.  BP 138/82   Pulse 83   Temp 98 F (36.7 C) (Oral)   Resp 16   Ht 5' 9.29" (1.76 m)   Wt 217 lb (98.4 kg)   SpO2 98%   BMI 31.78 kg/m   HENT:  Head: Normocephalic and atraumatic.  Right Ear: Hearing normal.  Left Ear: Hearing normal.  Eyes: Conjunctivae are normal. No scleral icterus.  Neck: Normal range of motion. Neck supple. No thyromegaly present.  Cardiovascular: Normal rate, regular rhythm  and normal heart sounds.  Pulses:      Radial pulses are 2+ on the right side, and 2+ on the left side.  Pulmonary/Chest: Effort normal and breath sounds normal.  Lymphadenopathy:       Head (right side): No tonsillar, no preauricular, no posterior auricular and no occipital adenopathy present.       Head (left side): No tonsillar, no preauricular, no posterior auricular and no occipital adenopathy present.    She has no cervical adenopathy.       Right: No supraclavicular adenopathy present.       Left: No supraclavicular adenopathy present.  Neurological: She is alert and oriented to person, place, and time. No sensory deficit.  Skin: Skin is warm, dry and intact. No rash noted. No cyanosis or erythema. Nails show no clubbing.  Psychiatric: She has a normal mood and affect. Her speech is normal and behavior is normal.    Wt Readings from Last 3 Encounters:  01/18/18 217 lb (98.4 kg)  08/27/17 207 lb 12.8 oz (94.3 kg)  05/31/17 218 lb 6.4 oz (99.1 kg)       Assessment & Plan:   Problem List Items Addressed This Visit    Hyperlipidemia    Unable to afford labs today. Healthy eating encouraged.      PANIC DISORDER    COntrolled on citalopram 40 mg and PRN alprazolam.      Relevant Medications   citalopram (CELEXA) 40 MG tablet   HYPERTENSION, BENIGN ESSENTIAL - Primary    Well controlled. Continue atenolol QD and lisinopril-HCTZ QOD. Encouraged smoking cessation.      Prediabetes    Unable to afford labs today. Healthy eating encouraged.           Return in about 3 months (around 04/17/2018) for re-evaluation of blood pressure, cholesterol, elevated glucose, mood.   Fernande Brashelle S. Kerri Asche, PA-C Primary Care at [facility]

## 2018-01-18 NOTE — Assessment & Plan Note (Signed)
Well controlled. Continue atenolol QD and lisinopril-HCTZ QOD. Encouraged smoking cessation.

## 2018-01-18 NOTE — Assessment & Plan Note (Signed)
COntrolled on citalopram 40 mg and PRN alprazolam.

## 2018-01-18 NOTE — Patient Instructions (Addendum)
Please call the [facility] and [facility] [phone]. They accept Medicaid.    IF you received an x-ray today, you will receive an invoice from [facility]. Please contact [facility] at [phone] with questions or concerns regarding your invoice.   IF you received labwork today, you will receive an invoice from [facility]. Please contact [facility] at [phone] with questions or concerns regarding your invoice.   Our billing staff will not be able to assist you with questions regarding bills from these companies.  You will be contacted with the lab results as soon as they are available. The fastest way to get your results is to activate your My Chart account. Instructions are located on the last page of this paperwork. If you have not heard from us regarding the results in 2 weeks, please contact this office.    Did you know that you begin to benefit from quitting smoking within the first twenty minutes? It's TRUE.  At 20 minutes: -blood pressure decreases -pulse rate drops -body temperature of hands and feet increases  At 8 hours: -carbon monoxide level in blood drops to normal -oxygen level in blood increases to normal  At 24 hours: -the chance of heart attack decreases  At 48 hours: -nerve endings start regrowing -ability to smell and taste is enhanced  2 weeks-3 months: -circulation improves -walking becomes easier -lung function improves  1-9 months: -coughing, sinus congestion, fatigue and shortness of breath decreases  1 year: -excess risk of heart disease is decreased to HALF that of a smoker  5 years: Stroke risk is reduced to that of people who have never smoked  10 years: -risk of lung cancer drops to as little as half that of continuing smokers -risk of cancer of the mouth, throat, esophagus, bladder, kidney and pancreas decreases -risk of ulcer decreases  15 years -risk of heart disease is now similar to  that of people who have never smoked -risk of death returns to nearly the level of people who have never smoked

## 2018-03-14 ENCOUNTER — Encounter: Payer: Self-pay | Admitting: Physician Assistant

## 2018-03-16 ENCOUNTER — Encounter: Payer: Self-pay | Admitting: Physician Assistant

## 2018-04-19 ENCOUNTER — Ambulatory Visit: Payer: PRIVATE HEALTH INSURANCE | Admitting: Physician Assistant

## 2018-06-04 ENCOUNTER — Other Ambulatory Visit: Payer: Self-pay | Admitting: Physician Assistant

## 2018-06-04 DIAGNOSIS — I10 Essential (primary) hypertension: Secondary | ICD-10-CM

## 2020-06-05 ENCOUNTER — Other Ambulatory Visit: Payer: Self-pay

## 2020-06-05 ENCOUNTER — Emergency Department (HOSPITAL_COMMUNITY)
Admission: EM | Admit: 2020-06-05 | Discharge: 2020-06-06 | Disposition: A | Discharge status: Home / Self Care | Payer: PRIVATE HEALTH INSURANCE | Attending: Emergency Medicine | Admitting: Emergency Medicine

## 2020-06-05 ENCOUNTER — Encounter (HOSPITAL_COMMUNITY): Payer: Self-pay | Admitting: *Deleted

## 2020-06-05 DIAGNOSIS — W293XXA Contact with powered garden and outdoor hand tools and machinery, initial encounter: Secondary | ICD-10-CM | POA: Insufficient documentation

## 2020-06-05 DIAGNOSIS — Y929 Unspecified place or not applicable: Secondary | ICD-10-CM | POA: Insufficient documentation

## 2020-06-05 DIAGNOSIS — Y999 Unspecified external cause status: Secondary | ICD-10-CM | POA: Insufficient documentation

## 2020-06-05 DIAGNOSIS — Y939 Activity, unspecified: Secondary | ICD-10-CM | POA: Insufficient documentation

## 2020-06-05 DIAGNOSIS — Z5321 Procedure and treatment not carried out due to patient leaving prior to being seen by health care provider: Secondary | ICD-10-CM | POA: Insufficient documentation

## 2020-06-05 DIAGNOSIS — S61221A Laceration with foreign body of left index finger without damage to nail, initial encounter: Secondary | ICD-10-CM | POA: Insufficient documentation

## 2020-06-05 NOTE — ED Triage Notes (Signed)
Pt was trimming shrubs, cut left base of index finger with trimmers, bleeding controlled

## 2020-06-06 ENCOUNTER — Other Ambulatory Visit: Payer: Self-pay

## 2020-06-06 ENCOUNTER — Encounter (HOSPITAL_BASED_OUTPATIENT_CLINIC_OR_DEPARTMENT_OTHER): Payer: Self-pay | Admitting: *Deleted

## 2020-06-06 ENCOUNTER — Emergency Department (HOSPITAL_BASED_OUTPATIENT_CLINIC_OR_DEPARTMENT_OTHER): Payer: PRIVATE HEALTH INSURANCE

## 2020-06-06 ENCOUNTER — Emergency Department (HOSPITAL_BASED_OUTPATIENT_CLINIC_OR_DEPARTMENT_OTHER)
Admission: EM | Admit: 2020-06-06 | Discharge: 2020-06-06 | Disposition: A | Discharge status: Home / Self Care | Payer: PRIVATE HEALTH INSURANCE | Attending: Emergency Medicine | Admitting: Emergency Medicine

## 2020-06-06 DIAGNOSIS — I10 Essential (primary) hypertension: Secondary | ICD-10-CM | POA: Insufficient documentation

## 2020-06-06 DIAGNOSIS — Z79899 Other long term (current) drug therapy: Secondary | ICD-10-CM | POA: Insufficient documentation

## 2020-06-06 DIAGNOSIS — S61211A Laceration without foreign body of left index finger without damage to nail, initial encounter: Secondary | ICD-10-CM | POA: Insufficient documentation

## 2020-06-06 DIAGNOSIS — Y929 Unspecified place or not applicable: Secondary | ICD-10-CM | POA: Insufficient documentation

## 2020-06-06 DIAGNOSIS — Z23 Encounter for immunization: Secondary | ICD-10-CM | POA: Insufficient documentation

## 2020-06-06 DIAGNOSIS — F17210 Nicotine dependence, cigarettes, uncomplicated: Secondary | ICD-10-CM | POA: Insufficient documentation

## 2020-06-06 DIAGNOSIS — S61412A Laceration without foreign body of left hand, initial encounter: Secondary | ICD-10-CM | POA: Insufficient documentation

## 2020-06-06 DIAGNOSIS — Y999 Unspecified external cause status: Secondary | ICD-10-CM | POA: Insufficient documentation

## 2020-06-06 DIAGNOSIS — Y939 Activity, unspecified: Secondary | ICD-10-CM | POA: Insufficient documentation

## 2020-06-06 DIAGNOSIS — W293XXA Contact with powered garden and outdoor hand tools and machinery, initial encounter: Secondary | ICD-10-CM | POA: Insufficient documentation

## 2020-06-06 MED ORDER — LIDOCAINE-EPINEPHRINE (PF) 2 %-1:200000 IJ SOLN: 10.0000 mL | Freq: Once | INTRAMUSCULAR | Status: AC

## 2020-06-06 MED ORDER — LIDOCAINE VISCOUS HCL 2 % MT SOLN
15.0000 mL | Freq: Once | OROMUCOSAL | Status: DC
  Filled 2020-06-06: qty 15

## 2020-06-06 MED ORDER — LIDOCAINE HCL 1 % IJ SOLN
INTRAMUSCULAR | Status: AC
  Filled 2020-06-06: qty 20

## 2020-06-06 MED ORDER — CEPHALEXIN 500 MG PO CAPS: 500.0000 mg | ORAL_CAPSULE | Freq: Four times a day (QID) | ORAL | 0 refills | Status: AC

## 2020-06-06 MED ORDER — CEPHALEXIN 250 MG PO CAPS
1000.0000 mg | ORAL_CAPSULE | Freq: Once | ORAL | Status: AC
  Administered 2020-06-06: 1000 mg via ORAL
  Filled 2020-06-06: qty 4

## 2020-06-06 MED ORDER — TETANUS-DIPHTH-ACELL PERTUSSIS 5-2.5-18.5 LF-MCG/0.5 IM SUSP
0.5000 mL | Freq: Once | INTRAMUSCULAR | Status: AC
  Administered 2020-06-06: 0.5 mL via INTRAMUSCULAR
  Filled 2020-06-06: qty 0.5

## 2020-06-06 MED ORDER — LIDOCAINE-EPINEPHRINE (PF) 2 %-1:200000 IJ SOLN
INTRAMUSCULAR | Status: AC
  Administered 2020-06-06: 10 mL
  Filled 2020-06-06: qty 10

## 2020-06-06 NOTE — ED Provider Notes (Signed)
[facility] Provider Note: Lowella Dell, MD, FACEP  CSN: [id number] MRN: [id number] ARRIVAL: 06/06/20 at 0034 ROOM: MH05/[facility]05   CHIEF COMPLAINT  Laceration   HISTORY OF PRESENT ILLNESS  06/06/20 12:53 AM [name] is a 54 y.o. female who cut her left hand with an Publishing rights manager but 7 PM yesterday evening.  She originally went to the [facility] long ED but left due to prolonged wait.  She has lacerations to her her vulvar mid left index finger and proximal to the base of the left index finger.  She has no numbness or functional deficit.  Tetanus is not up-to-date.  She denies other injury.  She rates her pain as a 2 out of 10, worse with movement or palpation.  Bleeding was brisk earlier but has been controlled with pressure.   Past Medical History:  Diagnosis Date  . Arthritis    knees, R>L  . Choledocholithiasis, status post ERCP 03/13/2013  . Cholelithiasis with cholecystitis 03/13/2013  . Depression   . Dysrhythmia    rare pvc   . Gallstones   . GERD (gastroesophageal reflux disease)   . Hypertension   . Panic disorder     Past Surgical History:  Procedure Laterality Date  . ABDOMINAL HYSTERECTOMY    . CHOLECYSTECTOMY N/A 03/14/2013   Procedure: LAPAROSCOPIC CHOLECYSTECTOMY WITH INTRAOPERATIVE CHOLANGIOGRAM;  Surgeon: Velora Heckler, MD;  Location: [facility];  Service: General;  Laterality: N/A;  . ERCP N/A 03/03/2013   Procedure: ENDOSCOPIC RETROGRADE CHOLANGIOPANCREATOGRAPHY (ERCP);  Surgeon: Theda Belfast, MD;  Location: [facility];  Service: Endoscopy;  Laterality: N/A;  . EUS N/A 03/03/2013   Procedure: UPPER ENDOSCOPIC ULTRASOUND (EUS) LINEAR;  Surgeon: Theda Belfast, MD;  Location: [facility];  Service: Endoscopy;  Laterality: N/A;    Family History  Problem Relation Age of Onset  . Dementia Father   . Heart disease Father   . Arthritis Father   . Hypertension Maternal Aunt   . Arthritis Sister   . Heart disease Sister     Social History    Tobacco Use  . Smoking status: Current Every Day Smoker    Packs/day: 1.50    Years: 30.00    Pack years: 45.00    Types: Cigarettes  . Smokeless tobacco: Never Used  Vaping Use  . Vaping Use: Never used  Substance Use Topics  . Alcohol use: No  . Drug use: No    Prior to Admission medications   Medication Sig Start Date End Date Taking? Authorizing Provider  acetaminophen (TYLENOL) 500 MG tablet Take 1,000 mg by mouth every 6 (six) hours as needed for mild pain, moderate pain or headache.    [provider]  ALPRAZolam Prudy Feeler) 1 MG tablet Take 0.5-1 tablets (0.5-1 mg total) by mouth 3 (three) times daily as needed for anxiety. 01/14/18   Porfirio Oar, PA  atenolol (TENORMIN) 50 MG tablet Take 1 tablet (50 mg total) by mouth every morning. 04/06/17   Porfirio Oar, PA  citalopram (CELEXA) 40 MG tablet Take 1 tablet (40 mg total) by mouth every morning. 01/18/18   Porfirio Oar, PA  ibuprofen (ADVIL,MOTRIN) 200 MG tablet Take 400-600 mg by mouth every 6 (six) hours as needed for pain.    [provider]  lisinopril-hydrochlorothiazide (PRINZIDE,ZESTORETIC) 10-12.5 MG tablet Take 1 tablet by mouth daily. 06/01/17   Porfirio Oar, PA  omeprazole (PRILOSEC) 20 MG capsule Take 2 capsules (40 mg total) by mouth daily. 08/23/13  Maretta Bees, MD    Allergies Benadryl [diphenhydramine]   REVIEW OF SYSTEMS  Negative except as noted here or in the History of Present Illness.   PHYSICAL EXAMINATION  Initial Vital Signs Blood pressure (!) 153/85, pulse 75, temperature 98.5 F (36.9 C), temperature source Oral, resp. rate 16, SpO2 100 %.  Examination General: Well-developed, well-nourished female in no acute distress; appearance consistent with age of record HENT: normocephalic; atraumatic Eyes: Normal appearance Neck: supple Heart: regular rate and rhythm Lungs: clear to auscultation bilaterally Abdomen: soft; nondistended; nontender; bowel sounds  present Extremities: No deformity; lacerations to vulvar left index finger and palm just proximal to the left index finger, index finger neurovascularly intact with intact tendon function:    Neurologic: Awake, alert and oriented; motor function intact in all extremities and symmetric; no facial droop Skin: Warm and dry Psychiatric: Normal mood and affect   RESULTS  Summary of this visit's results, reviewed and interpreted by myself:   EKG Interpretation  Date/Time:    Ventricular Rate:    PR Interval:    QRS Duration:   QT Interval:    QTC Calculation:   R Axis:     Text Interpretation:        Laboratory Studies: No results found for this or any previous visit (from the past 24 hour(s)). Imaging Studies: DG Hand Complete Left  Result Date: 06/06/2020 CLINICAL DATA:  Initial evaluation for acute injury, laceration to left index finger. EXAM: LEFT HAND - COMPLETE 3+ VIEW COMPARISON:  None. FINDINGS: No visible soft tissue laceration seen at the left first index finger. No radiopaque foreign body. No acute fracture dislocation. Scattered osteoarthritic changes present throughout the D IP and PIP joints. IMPRESSION: 1. No radiopaque foreign body identified about the left index finger or elsewhere within the left hand. 2. No acute osseous abnormality. Electronically Signed   By: Rise Mu M.D.   On: 06/06/2020 01:18    ED COURSE and MDM  Nursing notes, initial and subsequent vitals signs, including pulse oximetry, reviewed and interpreted by myself.  Vitals:   06/06/20 0039  BP: (!) 153/85  Pulse: 75  Resp: 16  Temp: 98.5 F (36.9 C)  TempSrc: Oral  SpO2: 100%   Medications  lidocaine (XYLOCAINE) 2 % viscous mouth solution 15 mL (15 mLs Mouth/Throat Not Given 06/06/20 0125)  cephALEXin (KEFLEX) capsule 1,000 mg (1,000 mg Oral Given 06/06/20 0123)  Tdap (BOOSTRIX) injection 0.5 mL (0.5 mLs Intramuscular Given 06/06/20 0120)  lidocaine-EPINEPHrine (XYLOCAINE W/EPI) 2  %-1:200000 (PF) injection 10 mL (10 mLs Intradermal Given by Other 06/06/20 0120)   Wounds closed as described below.  Patient started on Keflex for infection prophylaxis.  Tetanus updated.  PROCEDURES  Procedures  LACERATION REPAIR Performed by: Carlisle Beers Krystel Fletchall Authorized by: Carlisle Beers Calie Buttrey Consent: Verbal consent obtained. Risks and benefits: risks, benefits and alternatives were discussed Consent given by: patient Patient identity confirmed: provided demographic data Prepped and Draped in normal sterile fashion Wound explored  Laceration Location: Left hand  Laceration Length: 4 cm (complex, irregular)  No Foreign Bodies seen or palpated  Anesthesia: local infiltration  Local anesthetic: lidocaine 1% with epinephrine  Anesthetic total: 5 ml  Irrigation method: syringe Amount of cleaning: standard  Skin closure: 4-0 Prolene  Number of sutures: 6  Technique: Simple interrupted  Patient tolerance: Patient tolerated the procedure well with no immediate complications.  LACERATION REPAIR Performed by: Carlisle Beers Kareena Arrambide Authorized by: Carlisle Beers Ronita Hargreaves Consent: Verbal consent obtained. Risks and  benefits: risks, benefits and alternatives were discussed Consent given by: patient Patient identity confirmed: provided demographic data Prepped and Draped in normal sterile fashion Wound explored  Laceration Location: Left index finger  Laceration Length: 1 cm  No Foreign Bodies seen or palpated  Anesthesia: local infiltration  Local anesthetic: lidocaine 1% with epinephrine  Anesthetic total: 1 ml  Irrigation method: syringe Amount of cleaning: standard  Skin closure: 4-0 Prolene  Number of sutures: 2  Technique: Simple interrupted  Patient tolerance: Patient tolerated the procedure well with no immediate complications.     ED DIAGNOSES     ICD-10-CM   1. Laceration of left hand without foreign body, initial encounter  S61.412A   2. Laceration of left index  finger without foreign body without damage to nail, initial encounter  S61.211A   3. Contact with powered garden and outdoor hand tools and machinery, initial encounter  W29.Vernia Buff, MD 06/06/20 [phone]

## 2020-06-06 NOTE — ED Triage Notes (Signed)
Pt c/o lac to left index finger x 6 hrs ago

## 2020-06-20 ENCOUNTER — Emergency Department (HOSPITAL_BASED_OUTPATIENT_CLINIC_OR_DEPARTMENT_OTHER)
Admission: EM | Admit: 2020-06-20 | Discharge: 2020-06-20 | Disposition: A | Discharge status: Home / Self Care | Payer: PRIVATE HEALTH INSURANCE | Attending: Emergency Medicine | Admitting: Emergency Medicine

## 2020-06-20 ENCOUNTER — Encounter (HOSPITAL_BASED_OUTPATIENT_CLINIC_OR_DEPARTMENT_OTHER): Payer: Self-pay

## 2020-06-20 ENCOUNTER — Other Ambulatory Visit: Payer: Self-pay

## 2020-06-20 DIAGNOSIS — W293XXD Contact with powered garden and outdoor hand tools and machinery, subsequent encounter: Secondary | ICD-10-CM | POA: Insufficient documentation

## 2020-06-20 DIAGNOSIS — S61211D Laceration without foreign body of left index finger without damage to nail, subsequent encounter: Secondary | ICD-10-CM | POA: Insufficient documentation

## 2020-06-20 DIAGNOSIS — Z4802 Encounter for removal of sutures: Secondary | ICD-10-CM | POA: Insufficient documentation

## 2020-06-20 NOTE — ED Triage Notes (Signed)
Pt for suture removal left index finger and left hand-NAD-steady gait

## 2020-06-20 NOTE — Discharge Instructions (Addendum)
Keep Neosporin on the area. Return to the ER for signs of infection including redness, swelling, fever or additional trauma.

## 2020-06-20 NOTE — ED Provider Notes (Signed)
[facility] EMERGENCY DEPARTMENT Provider Note   CSN: [id number] Arrival date & time: 06/20/20  1610     History Chief Complaint  Patient presents with  . Suture / Staple Removal    [name] is a 54 y.o. female with a past medical history of arthritis presenting to the ED for suture removal.  Patient cut her left index finger on electric hedge clippers on 06/06/2020.  States that she has been keeping the area clean and sutures have been intact.  She denies any other complaints.  Denies fevers or swelling.  HPI     Past Medical History:  Diagnosis Date  . Arthritis    knees, R>L  . Choledocholithiasis, status post ERCP 03/13/2013  . Cholelithiasis with cholecystitis 03/13/2013  . Depression   . Dysrhythmia    rare pvc   . Gallstones   . GERD (gastroesophageal reflux disease)   . Hypertension   . Panic disorder     Patient Active Problem List   Diagnosis Date Noted  . Generalized anxiety disorder 04/06/2017  . Chronic prescription benzodiazepine use 04/06/2017  . Prediabetes 09/12/2013  . Arthritis of both knees 09/08/2013  . LUMBAGO 07/31/2010  . GANGLION CYST, WRIST, RIGHT 07/31/2010  . TMJ SYNDROME 02/07/2009  . PREMATURE VENTRICULAR CONTRACTIONS 12/10/2008  . PANIC DISORDER 10/17/2008  . Smoker 10/17/2008  . Hyperlipidemia 07/17/2008  . GERD 03/19/2008  . BREAST MASS, RIGHT 12/07/2006  . HYPERTENSION, BENIGN ESSENTIAL 12/07/2005    Past Surgical History:  Procedure Laterality Date  . ABDOMINAL HYSTERECTOMY    . CHOLECYSTECTOMY N/A 03/14/2013   Procedure: LAPAROSCOPIC CHOLECYSTECTOMY WITH INTRAOPERATIVE CHOLANGIOGRAM;  Surgeon: Velora Heckler, MD;  Location: [facility];  Service: General;  Laterality: N/A;  . ERCP N/A 03/03/2013   Procedure: ENDOSCOPIC RETROGRADE CHOLANGIOPANCREATOGRAPHY (ERCP);  Surgeon: Theda Belfast, MD;  Location: [facility];  Service: Endoscopy;  Laterality: N/A;  . EUS N/A 03/03/2013   Procedure: UPPER ENDOSCOPIC ULTRASOUND  (EUS) LINEAR;  Surgeon: Theda Belfast, MD;  Location: [facility];  Service: Endoscopy;  Laterality: N/A;     OB History   No obstetric history on file.     Family History  Problem Relation Age of Onset  . Dementia Father   . Heart disease Father   . Arthritis Father   . Hypertension Maternal Aunt   . Arthritis Sister   . Heart disease Sister     Social History   Tobacco Use  . Smoking status: Current Every Day Smoker    Packs/day: 1.50    Years: 30.00    Pack years: 45.00    Types: Cigarettes  . Smokeless tobacco: Never Used  Vaping Use  . Vaping Use: Never used  Substance Use Topics  . Alcohol use: No  . Drug use: No    Home Medications Prior to Admission medications   Medication Sig Start Date End Date Taking? Authorizing Provider  ALPRAZolam Prudy Feeler) 1 MG tablet Take 0.5-1 tablets (0.5-1 mg total) by mouth 3 (three) times daily as needed for anxiety. 01/14/18   Porfirio Oar, PA  atenolol (TENORMIN) 50 MG tablet Take 1 tablet (50 mg total) by mouth every morning. 04/06/17   Porfirio Oar, PA  cephALEXin (KEFLEX) 500 MG capsule Take 1 capsule (500 mg total) by mouth 4 (four) times daily. 06/06/20   Molpus, John, MD  citalopram (CELEXA) 40 MG tablet Take 1 tablet (40 mg total) by mouth every morning. 01/18/18   Porfirio Oar, PA  lisinopril-hydrochlorothiazide (PRINZIDE,ZESTORETIC) 10-12.5  MG tablet Take 1 tablet by mouth daily. 06/01/17   Porfirio Oar, PA  omeprazole (PRILOSEC) 20 MG capsule Take 2 capsules (40 mg total) by mouth daily. 08/23/13   Ghimire, Werner Lean, MD    Allergies    Benadryl [diphenhydramine]  Review of Systems   Review of Systems  Constitutional: Negative for chills and fever.  Skin: Positive for wound.  Neurological: Negative for weakness and numbness.    Physical Exam Updated Vital Signs BP 116/77 (BP Location: Left Arm)   Pulse 83   Temp 98.7 F (37.1 C) (Oral)   Resp 18   SpO2 96%   Physical Exam Vitals and nursing note  reviewed.  Constitutional:      General: She is not in acute distress.    Appearance: She is well-developed. She is not diaphoretic.  HENT:     Head: Normocephalic and atraumatic.  Eyes:     General: No scleral icterus.    Conjunctiva/sclera: Conjunctivae normal.  Pulmonary:     Effort: Pulmonary effort is normal. No respiratory distress.  Musculoskeletal:     Cervical back: Normal range of motion.  Skin:    Findings: No rash.     Comments: 8 sutures in place of the left index finger.  Wound closure appears normal without drainage, redness.  Neurological:     Mental Status: She is alert.     ED Results / Procedures / Treatments   Labs (all labs ordered are listed, but only abnormal results are displayed) Labs Reviewed - No data to display  EKG None  Radiology No results found.  Procedures .Suture Removal  Date/Time: 06/20/2020 4:46 PM Performed by: Dietrich Pates, PA-C Authorized by: Dietrich Pates, PA-C   Consent:    Consent obtained:  Verbal   Consent given by:  Patient   Risks discussed:  Bleeding, pain and wound separation   Alternatives discussed:  No treatment Location:    Location:  Upper extremity   Upper extremity location:  Hand   Hand location:  L index finger Procedure details:    Wound appearance:  Good wound healing, no signs of infection and clean   Number of sutures removed:  8 Post-procedure details:    Patient tolerance of procedure:  Tolerated well, no immediate complications   (including critical care time)  Medications Ordered in ED Medications - No data to display  ED Course  I have reviewed the triage vital signs and the nursing notes.  Pertinent labs & imaging results that were available during my care of the patient were reviewed by me and considered in my medical decision making (see chart for details).    MDM Rules/Calculators/A&P                          Patient presents to ED for suture removal and wound check as above.  Vitals normal.  Laceration appears to be healing well with no signs of infection or dehiscence. Sutures removed successfully here, patient tolerated procedure well. Scar minimization & wound care instructions given. ED return precautions given at discharge.    Patient is hemodynamically stable, in NAD, and able to ambulate in the ED. Evaluation does not show pathology that would require ongoing emergent intervention or inpatient treatment. I explained the diagnosis to the patient. Pain has been managed and has no complaints prior to discharge. Patient is comfortable with above plan and is stable for discharge at this time. All questions were answered prior to  disposition. Strict return precautions for returning to the ED were discussed. Encouraged follow up with PCP.   An After Visit Summary was printed and given to the patient.   Portions of this note were generated with Scientist, clinical (histocompatibility and immunogenetics). Dictation errors may occur despite best attempts at proofreading.  Final Clinical Impression(s) / ED Diagnoses Final diagnoses:  Visit for suture removal    Rx / DC Orders ED Discharge Orders    None       Dietrich Pates, PA-C 06/20/20 1647    Melene Plan, DO 06/20/20 1944

## 2020-08-31 ENCOUNTER — Encounter (HOSPITAL_BASED_OUTPATIENT_CLINIC_OR_DEPARTMENT_OTHER): Payer: Self-pay | Admitting: Emergency Medicine

## 2020-08-31 ENCOUNTER — Emergency Department (HOSPITAL_BASED_OUTPATIENT_CLINIC_OR_DEPARTMENT_OTHER)
Admission: EM | Admit: 2020-08-31 | Discharge: 2020-08-31 | Disposition: A | Discharge status: Home / Self Care | Payer: Self-pay | Attending: Emergency Medicine | Admitting: Emergency Medicine

## 2020-08-31 ENCOUNTER — Other Ambulatory Visit: Payer: Self-pay

## 2020-08-31 DIAGNOSIS — Z79899 Other long term (current) drug therapy: Secondary | ICD-10-CM | POA: Insufficient documentation

## 2020-08-31 DIAGNOSIS — I10 Essential (primary) hypertension: Secondary | ICD-10-CM | POA: Insufficient documentation

## 2020-08-31 DIAGNOSIS — F17210 Nicotine dependence, cigarettes, uncomplicated: Secondary | ICD-10-CM | POA: Insufficient documentation

## 2020-08-31 DIAGNOSIS — Z23 Encounter for immunization: Secondary | ICD-10-CM | POA: Insufficient documentation

## 2020-08-31 DIAGNOSIS — S51851A Open bite of right forearm, initial encounter: Secondary | ICD-10-CM | POA: Insufficient documentation

## 2020-08-31 DIAGNOSIS — W5551XA Bitten by raccoon, initial encounter: Secondary | ICD-10-CM | POA: Insufficient documentation

## 2020-08-31 DIAGNOSIS — Z2914 Encounter for prophylactic rabies immune globin: Secondary | ICD-10-CM | POA: Insufficient documentation

## 2020-08-31 MED ORDER — RABIES VACCINE, PCEC IM SUSR
1.0000 mL | Freq: Once | INTRAMUSCULAR | Status: AC
  Administered 2020-08-31: 1 mL via INTRAMUSCULAR
  Filled 2020-08-31: qty 1

## 2020-08-31 MED ORDER — RABIES IMMUNE GLOBULIN 150 UNIT/ML IM INJ
20.0000 [IU]/kg | INJECTION | Freq: Once | INTRAMUSCULAR | Status: AC
  Administered 2020-08-31: 1950 [IU]
  Filled 2020-08-31: qty 14

## 2020-08-31 MED ORDER — AMOXICILLIN-POT CLAVULANATE 875-125 MG PO TABS
1.0000 | ORAL_TABLET | Freq: Once | ORAL | Status: AC
  Administered 2020-08-31: 1 via ORAL
  Filled 2020-08-31: qty 1

## 2020-08-31 MED ORDER — TETANUS-DIPHTH-ACELL PERTUSSIS 5-2.5-18.5 LF-MCG/0.5 IM SUSP: 0.5000 mL | Freq: Once | INTRAMUSCULAR | Status: DC

## 2020-08-31 NOTE — ED Triage Notes (Signed)
Patient presents with superficial laceration patient states is from a racoon bite; states occurred just pta; states she was feeding them and racoon bit her.

## 2020-08-31 NOTE — ED Provider Notes (Signed)
[facility] EMERGENCY DEPARTMENT Provider Note   CSN: [id number] Arrival date & time: 08/31/20  0253     History Chief Complaint  Patient presents with  . Animal Bite    [name] is a 54 y.o. female.  The history is provided by the patient.  Animal Bite Contact animal:  Raccoon Location:  Shoulder/arm Shoulder/arm injury location:  R forearm Pain details:    Quality:  Aching   Severity:  Mild   Timing:  Constant   Progression:  Unchanged Incident location:  Outside Provoked: unprovoked   Notifications:  None Animal's rabies vaccination status:  Never received Animal in possession: no   Tetanus status:  Up to date Relieved by:  Nothing Worsened by:  Nothing Ineffective treatments:  None tried Associated symptoms: no fever   Feeding a group of raccoons and one but her on the right lateral forearm.  Tetanus UTD     Past Medical History:  Diagnosis Date  . Arthritis    knees, R>L  . Choledocholithiasis, status post ERCP 03/13/2013  . Cholelithiasis with cholecystitis 03/13/2013  . Depression   . Dysrhythmia    rare pvc   . Gallstones   . GERD (gastroesophageal reflux disease)   . Hypertension   . Panic disorder     Patient Active Problem List   Diagnosis Date Noted  . Generalized anxiety disorder 04/06/2017  . Chronic prescription benzodiazepine use 04/06/2017  . Prediabetes 09/12/2013  . Arthritis of both knees 09/08/2013  . LUMBAGO 07/31/2010  . GANGLION CYST, WRIST, RIGHT 07/31/2010  . TMJ SYNDROME 02/07/2009  . PREMATURE VENTRICULAR CONTRACTIONS 12/10/2008  . PANIC DISORDER 10/17/2008  . Smoker 10/17/2008  . Hyperlipidemia 07/17/2008  . GERD 03/19/2008  . BREAST MASS, RIGHT 12/07/2006  . HYPERTENSION, BENIGN ESSENTIAL 12/07/2005    Past Surgical History:  Procedure Laterality Date  . ABDOMINAL HYSTERECTOMY    . CHOLECYSTECTOMY N/A 03/14/2013   Procedure: LAPAROSCOPIC CHOLECYSTECTOMY WITH INTRAOPERATIVE CHOLANGIOGRAM;  Surgeon:  Velora Heckler, MD;  Location: [facility];  Service: General;  Laterality: N/A;  . ERCP N/A 03/03/2013   Procedure: ENDOSCOPIC RETROGRADE CHOLANGIOPANCREATOGRAPHY (ERCP);  Surgeon: Theda Belfast, MD;  Location: [facility];  Service: Endoscopy;  Laterality: N/A;  . EUS N/A 03/03/2013   Procedure: UPPER ENDOSCOPIC ULTRASOUND (EUS) LINEAR;  Surgeon: Theda Belfast, MD;  Location: [facility];  Service: Endoscopy;  Laterality: N/A;     OB History   No obstetric history on file.     Family History  Problem Relation Age of Onset  . Dementia Father   . Heart disease Father   . Arthritis Father   . Hypertension Maternal Aunt   . Arthritis Sister   . Heart disease Sister     Social History   Tobacco Use  . Smoking status: Current Every Day Smoker    Packs/day: 1.50    Years: 30.00    Pack years: 45.00    Types: Cigarettes  . Smokeless tobacco: Never Used  Vaping Use  . Vaping Use: Never used  Substance Use Topics  . Alcohol use: No  . Drug use: No    Home Medications Prior to Admission medications   Medication Sig Start Date End Date Taking? Authorizing Provider  ALPRAZolam Prudy Feeler) 1 MG tablet Take 0.5-1 tablets (0.5-1 mg total) by mouth 3 (three) times daily as needed for anxiety. 01/14/18   Porfirio Oar, PA  atenolol (TENORMIN) 50 MG tablet Take 1 tablet (50 mg total) by mouth every morning.  04/06/17   Porfirio Oar, PA  cephALEXin (KEFLEX) 500 MG capsule Take 1 capsule (500 mg total) by mouth 4 (four) times daily. 06/06/20   Molpus, John, MD  citalopram (CELEXA) 40 MG tablet Take 1 tablet (40 mg total) by mouth every morning. 01/18/18   Porfirio Oar, PA  lisinopril-hydrochlorothiazide (PRINZIDE,ZESTORETIC) 10-12.5 MG tablet Take 1 tablet by mouth daily. 06/01/17   Porfirio Oar, PA  omeprazole (PRILOSEC) 20 MG capsule Take 2 capsules (40 mg total) by mouth daily. 08/23/13   Ghimire, Werner Lean, MD    Allergies    Benadryl [diphenhydramine]  Review of Systems   Review of  Systems  Constitutional: Negative for fever.  HENT: Negative for congestion.   Eyes: Negative for visual disturbance.  Respiratory: Negative for chest tightness.   Cardiovascular: Negative for chest pain.  Gastrointestinal: Negative for abdominal pain.  Genitourinary: Negative for difficulty urinating.  Musculoskeletal: Negative for arthralgias.  Skin: Positive for wound.  Neurological: Negative for dizziness.  Psychiatric/Behavioral: Negative for agitation.  All other systems reviewed and are negative.   Physical Exam Updated Vital Signs BP (!) 146/91 (BP Location: Right Arm)   Pulse (!) 52   Temp 97.6 F (36.4 C) (Oral)   Resp 16   Ht 5\' 8"  (1.727 m)   Wt 98.9 kg   SpO2 98%   BMI 33.15 kg/m   Physical Exam Vitals and nursing note reviewed.  Constitutional:      General: She is not in acute distress.    Appearance: Normal appearance.  HENT:     Head: Normocephalic and atraumatic.     Nose: Nose normal.  Eyes:     Conjunctiva/sclera: Conjunctivae normal.     Pupils: Pupils are equal, round, and reactive to light.  Cardiovascular:     Rate and Rhythm: Normal rate and regular rhythm.     Pulses: Normal pulses.     Heart sounds: Normal heart sounds.  Pulmonary:     Effort: Pulmonary effort is normal.     Breath sounds: Normal breath sounds.  Abdominal:     General: Abdomen is flat. Bowel sounds are normal.     Tenderness: There is no abdominal tenderness. There is no guarding.  Musculoskeletal:        General: Normal range of motion.       Arms:     Cervical back: Normal range of motion and neck supple.  Skin:    General: Skin is warm and dry.     Capillary Refill: Capillary refill takes less than 2 seconds.  Neurological:     General: No focal deficit present.     Mental Status: She is alert and oriented to person, place, and time.     Deep Tendon Reflexes: Reflexes normal.  Psychiatric:        Mood and Affect: Mood normal.        Behavior: Behavior  normal.     ED Results / Procedures / Treatments   Labs (all labs ordered are listed, but only abnormal results are displayed) Labs Reviewed - No data to display  EKG None  Radiology No results found.  Procedures Procedures (including critical care time)  Medications Ordered in ED Medications  Tdap (BOOSTRIX) injection 0.5 mL (has no administration in time range)  amoxicillin-clavulanate (AUGMENTIN) 875-125 MG per tablet 1 tablet (has no administration in time range)  rabies vaccine (RABAVERT) injection 1 mL (has no administration in time range)  rabies immune globulin (HYPERAB/KEDRAB) injection 1,950 Units (has no  administration in time range)    ED Course  I have reviewed the triage vital signs and the nursing notes.  Pertinent labs & imaging results that were available during my care of the patient were reviewed by me and considered in my medical decision making (see chart for details).    RIG and Rabies vaccine started immediately, wounds cleansed thoroughly in the ED.  Augmentin initiated and vaccination follow up schedule given.  Wound care instructions given.  Follow up at urgent care as directed for the rest of the vaccination series.    [name] was evaluated in Emergency Department on 08/31/2020 for the symptoms described in the history of present illness. She was evaluated in the context of the global COVID-19 pandemic, which necessitated consideration that the patient might be at risk for infection with the SARS-CoV-2 virus that causes COVID-19. Institutional protocols and algorithms that pertain to the evaluation of patients at risk for COVID-19 are in a state of rapid change based on information released by regulatory bodies including the CDC and federal and state organizations. These policies and algorithms were followed during the patient's care in the ED.  Final Clinical Impression(s) / ED Diagnoses Final diagnoses:  Raccoon bite, initial encounter  Return  for intractable cough, coughing up blood,fevers >100.4 unrelieved by medication, shortness of breath, intractable vomiting, chest pain, shortness of breath, weakness,numbness, changes in speech, facial asymmetry,abdominal pain, passing out,Inability to tolerate liquids or food, cough, altered mental status or any concerns. No signs of systemic illness or infection. The patient is nontoxic-appearing on exam and vital signs are within normal limits.   I have reviewed the triage vital signs and the nursing notes. Pertinent labs &imaging results that were available during my care of the patient were reviewed by me and considered in my medical decision making (see chart for details).After history, exam, and medical workup I feel the patient has beenappropriately medically screened and is safe for discharge home. Pertinent diagnoses were discussed with the patient. Patient was given return precautions.      Chella Chapdelaine, MD 08/31/20 [id number]

## 2020-09-03 ENCOUNTER — Other Ambulatory Visit: Payer: Self-pay

## 2020-09-03 ENCOUNTER — Ambulatory Visit (HOSPITAL_COMMUNITY)
Admission: EM | Admit: 2020-09-03 | Discharge: 2020-09-03 | Disposition: A | Discharge status: Home / Self Care | Payer: Self-pay | Attending: Urgent Care | Admitting: Urgent Care

## 2020-09-03 DIAGNOSIS — Z203 Contact with and (suspected) exposure to rabies: Secondary | ICD-10-CM

## 2020-09-03 DIAGNOSIS — Z23 Encounter for immunization: Secondary | ICD-10-CM

## 2020-09-03 MED ORDER — RABIES VACCINE, PCEC IM SUSR
INTRAMUSCULAR | Status: AC
  Filled 2020-09-03: qty 1

## 2020-09-03 MED ORDER — RABIES VACCINE, PCEC IM SUSR
1.0000 mL | Freq: Once | INTRAMUSCULAR | Status: AC
  Administered 2020-09-03: 1 mL via INTRAMUSCULAR

## 2020-09-03 NOTE — ED Notes (Signed)
Pt presents for day 3 rabies vaccine. Denies any complaints.

## 2020-09-07 ENCOUNTER — Ambulatory Visit (HOSPITAL_COMMUNITY)
Admission: EM | Admit: 2020-09-07 | Discharge: 2020-09-07 | Disposition: A | Discharge status: Home / Self Care | Payer: Self-pay | Attending: Internal Medicine | Admitting: Internal Medicine

## 2020-09-07 ENCOUNTER — Other Ambulatory Visit: Payer: Self-pay

## 2020-09-07 DIAGNOSIS — Z203 Contact with and (suspected) exposure to rabies: Secondary | ICD-10-CM

## 2020-09-07 MED ORDER — RABIES VACCINE, PCEC IM SUSR
1.0000 mL | Freq: Once | INTRAMUSCULAR | Status: AC
  Administered 2020-09-07: 1 mL via INTRAMUSCULAR

## 2020-09-07 MED ORDER — RABIES VACCINE, PCEC IM SUSR
INTRAMUSCULAR | Status: AC
  Filled 2020-09-07: qty 1

## 2020-09-07 NOTE — ED Triage Notes (Signed)
Pt presents for third rabies vaccine

## 2020-09-14 ENCOUNTER — Ambulatory Visit (HOSPITAL_COMMUNITY)
Admission: RE | Admit: 2020-09-14 | Discharge: 2020-09-14 | Disposition: A | Discharge status: Home / Self Care | Payer: Self-pay | Source: Ambulatory Visit | Attending: Family Medicine | Admitting: Family Medicine

## 2020-09-14 ENCOUNTER — Other Ambulatory Visit: Payer: Self-pay

## 2020-09-14 DIAGNOSIS — Z203 Contact with and (suspected) exposure to rabies: Secondary | ICD-10-CM

## 2020-09-14 MED ORDER — RABIES VACCINE, PCEC IM SUSR
INTRAMUSCULAR | Status: AC
  Filled 2020-09-14: qty 1

## 2020-09-14 MED ORDER — RABIES VACCINE, PCEC IM SUSR
1.0000 mL | Freq: Once | INTRAMUSCULAR | Status: AC
  Administered 2020-09-14: 1 mL via INTRAMUSCULAR

## 2020-09-14 NOTE — ED Triage Notes (Signed)
Pt here for for last rabies shot

## 2020-09-16 ENCOUNTER — Emergency Department (HOSPITAL_BASED_OUTPATIENT_CLINIC_OR_DEPARTMENT_OTHER)
Admission: EM | Admit: 2020-09-16 | Discharge: 2020-09-16 | Disposition: A | Discharge status: Home / Self Care | Payer: Self-pay | Attending: Emergency Medicine | Admitting: Emergency Medicine

## 2020-09-16 ENCOUNTER — Other Ambulatory Visit: Payer: Self-pay

## 2020-09-16 ENCOUNTER — Encounter (HOSPITAL_BASED_OUTPATIENT_CLINIC_OR_DEPARTMENT_OTHER): Payer: Self-pay

## 2020-09-16 DIAGNOSIS — Z79899 Other long term (current) drug therapy: Secondary | ICD-10-CM | POA: Insufficient documentation

## 2020-09-16 DIAGNOSIS — F17210 Nicotine dependence, cigarettes, uncomplicated: Secondary | ICD-10-CM | POA: Insufficient documentation

## 2020-09-16 DIAGNOSIS — I10 Essential (primary) hypertension: Secondary | ICD-10-CM | POA: Insufficient documentation

## 2020-09-16 DIAGNOSIS — R21 Rash and other nonspecific skin eruption: Secondary | ICD-10-CM | POA: Insufficient documentation

## 2020-09-16 MED ORDER — PREDNISONE 20 MG PO TABS
20.0000 mg | ORAL_TABLET | Freq: Every day | ORAL | 0 refills | Status: AC
Start: 2020-09-16 — End: 2020-09-21

## 2020-09-16 MED ORDER — PREDNISONE 20 MG PO TABS
20.0000 mg | ORAL_TABLET | Freq: Once | ORAL | Status: AC
  Administered 2020-09-16: 20 mg via ORAL

## 2020-09-16 NOTE — ED Triage Notes (Signed)
Pt c/o scattered rash x 9 days-started 2 days after working in the yard-NAD-steady gait

## 2020-09-16 NOTE — ED Notes (Signed)
AVS and Rx reviewed with pt, reinforced instructions from EDP as outlined on AVS, copy of AVS provided, opportunity for questions provided as well

## 2020-09-16 NOTE — ED Provider Notes (Signed)
[facility] EMERGENCY DEPARTMENT Provider Note   CSN: [id number] Arrival date & time: 09/16/20  1855     History Chief Complaint  Patient presents with   Rash    [name] is a 54 y.o. female.  The history is provided by the patient.  Rash Location:  Shoulder/arm Severity:  Mild Onset quality:  Gradual Timing:  Constant Chronicity:  New Context: plant contact   Relieved by:  Nothing Worsened by:  Nothing Associated symptoms: no abdominal pain, no diarrhea, no fatigue, no fever, no hoarse voice, no induration, no joint pain, no myalgias, no nausea, no periorbital edema, no shortness of breath, no sore throat, no throat swelling, no tongue swelling, no URI, not vomiting and not wheezing        Past Medical History:  Diagnosis Date   Arthritis    knees, R>L   Choledocholithiasis, status post ERCP 03/13/2013   Cholelithiasis with cholecystitis 03/13/2013   Depression    Dysrhythmia    rare pvc    Gallstones    GERD (gastroesophageal reflux disease)    Hypertension    Panic disorder     Patient Active Problem List   Diagnosis Date Noted   Generalized anxiety disorder 04/06/2017   Chronic prescription benzodiazepine use 04/06/2017   Prediabetes 09/12/2013   Arthritis of both knees 09/08/2013   LUMBAGO 07/31/2010   GANGLION CYST, WRIST, RIGHT 07/31/2010   TMJ SYNDROME 02/07/2009   PREMATURE VENTRICULAR CONTRACTIONS 12/10/2008   PANIC DISORDER 10/17/2008   Smoker 10/17/2008   Hyperlipidemia 07/17/2008   GERD 03/19/2008   BREAST MASS, RIGHT 12/07/2006   HYPERTENSION, BENIGN ESSENTIAL 12/07/2005    Past Surgical History:  Procedure Laterality Date   ABDOMINAL HYSTERECTOMY     CHOLECYSTECTOMY N/A 03/14/2013   Procedure: LAPAROSCOPIC CHOLECYSTECTOMY WITH INTRAOPERATIVE CHOLANGIOGRAM;  Surgeon: Velora Heckler, MD;  Location: [facility];  Service: General;  Laterality: N/A;   ERCP N/A 03/03/2013   Procedure: ENDOSCOPIC RETROGRADE  CHOLANGIOPANCREATOGRAPHY (ERCP);  Surgeon: Theda Belfast, MD;  Location: [facility];  Service: Endoscopy;  Laterality: N/A;   EUS N/A 03/03/2013   Procedure: UPPER ENDOSCOPIC ULTRASOUND (EUS) LINEAR;  Surgeon: Theda Belfast, MD;  Location: [facility];  Service: Endoscopy;  Laterality: N/A;     OB History   No obstetric history on file.     Family History  Problem Relation Age of Onset   Dementia Father    Heart disease Father    Arthritis Father    Hypertension Maternal Aunt    Arthritis Sister    Heart disease Sister     Social History   Tobacco Use   Smoking status: Current Every Day Smoker    Packs/day: 1.50    Years: 30.00    Pack years: 45.00    Types: Cigarettes   Smokeless tobacco: Never Used  Building services engineer Use: Never used  Substance Use Topics   Alcohol use: No   Drug use: No    Home Medications Prior to Admission medications   Medication Sig Start Date End Date Taking? Authorizing Provider  ALPRAZolam Prudy Feeler) 1 MG tablet Take 0.5-1 tablets (0.5-1 mg total) by mouth 3 (three) times daily as needed for anxiety. 01/14/18   Porfirio Oar, PA  atenolol (TENORMIN) 50 MG tablet Take 1 tablet (50 mg total) by mouth every morning. 04/06/17   Porfirio Oar, PA  cephALEXin (KEFLEX) 500 MG capsule Take 1 capsule (500 mg total) by mouth 4 (four) times daily. 06/06/20  Molpus, John, MD  citalopram (CELEXA) 40 MG tablet Take 1 tablet (40 mg total) by mouth every morning. 01/18/18   Porfirio Oar, PA  lisinopril-hydrochlorothiazide (PRINZIDE,ZESTORETIC) 10-12.5 MG tablet Take 1 tablet by mouth daily. 06/01/17   Porfirio Oar, PA  omeprazole (PRILOSEC) 20 MG capsule Take 2 capsules (40 mg total) by mouth daily. 08/23/13   Ghimire, Werner Lean, MD  predniSONE (DELTASONE) 20 MG tablet Take 1 tablet (20 mg total) by mouth daily for 5 doses. 09/16/20 09/21/20  Fremont Skalicky, DO    Allergies    Benadryl [diphenhydramine]  Review of Systems   Review of  Systems  Constitutional: Negative for fatigue and fever.  HENT: Negative for hoarse voice and sore throat.   Respiratory: Negative for shortness of breath and wheezing.   Gastrointestinal: Negative for abdominal pain, diarrhea, nausea and vomiting.  Musculoskeletal: Negative for arthralgias and myalgias.  Skin: Positive for rash.    Physical Exam Updated Vital Signs There were no vitals taken for this visit.  Physical Exam Constitutional:      General: She is not in acute distress. Cardiovascular:     Pulses: Normal pulses.  Skin:    Capillary Refill: Capillary refill takes less than 2 seconds.     Findings: Rash present.     Comments: Vesicular type rash bilateral arms  Neurological:     Mental Status: She is alert.     ED Results / Procedures / Treatments   Labs (all labs ordered are listed, but only abnormal results are displayed) Labs Reviewed - No data to display  EKG None  Radiology No results found.  Procedures Procedures (including critical care time)  Medications Ordered in ED Medications  predniSONE (DELTASONE) tablet 20 mg (has no administration in time range)    ED Course  I have reviewed the triage vital signs and the nursing notes.  Pertinent labs & imaging results that were available during my care of the patient were reviewed by me and considered in my medical decision making (see chart for details).    MDM Rules/Calculators/A&P                          [name] is a 54 year old female here for rash.  Rash started after working in the yard.  Overall appears to be contact dermatitis likely from poison oak or poison ivy.  Will prescribe prednisone.  Overall appears well.  Discharged in ED in good condition.  This chart was dictated using voice recognition software.  Despite best efforts to proofread,  errors can occur which can change the documentation meaning.    Final Clinical Impression(s) / ED Diagnoses Final diagnoses:  Rash     Rx / DC Orders ED Discharge Orders         Ordered    predniSONE (DELTASONE) 20 MG tablet  Daily        09/16/20 2020           Virgina Norfolk, DO 09/16/20 2023

## 2024-01-04 ENCOUNTER — Emergency Department (HOSPITAL_BASED_OUTPATIENT_CLINIC_OR_DEPARTMENT_OTHER)
Admission: EM | Admit: 2024-01-04 | Discharge: 2024-01-04 | Disposition: A | Discharge status: Home / Self Care | Payer: Medicaid Other | Attending: Emergency Medicine | Admitting: Emergency Medicine

## 2024-01-04 ENCOUNTER — Other Ambulatory Visit: Payer: Self-pay

## 2024-01-04 ENCOUNTER — Encounter (HOSPITAL_BASED_OUTPATIENT_CLINIC_OR_DEPARTMENT_OTHER): Payer: Self-pay | Admitting: Emergency Medicine

## 2024-01-04 ENCOUNTER — Emergency Department (HOSPITAL_BASED_OUTPATIENT_CLINIC_OR_DEPARTMENT_OTHER): Payer: Medicaid Other

## 2024-01-04 DIAGNOSIS — H539 Unspecified visual disturbance: Secondary | ICD-10-CM | POA: Diagnosis not present

## 2024-01-04 DIAGNOSIS — Z7982 Long term (current) use of aspirin: Secondary | ICD-10-CM | POA: Insufficient documentation

## 2024-01-04 DIAGNOSIS — Z8673 Personal history of transient ischemic attack (TIA), and cerebral infarction without residual deficits: Secondary | ICD-10-CM | POA: Diagnosis not present

## 2024-01-04 DIAGNOSIS — Z79899 Other long term (current) drug therapy: Secondary | ICD-10-CM | POA: Diagnosis not present

## 2024-01-04 DIAGNOSIS — I10 Essential (primary) hypertension: Secondary | ICD-10-CM | POA: Diagnosis not present

## 2024-01-04 DIAGNOSIS — R519 Headache, unspecified: Secondary | ICD-10-CM | POA: Diagnosis present

## 2024-01-04 DIAGNOSIS — F17210 Nicotine dependence, cigarettes, uncomplicated: Secondary | ICD-10-CM | POA: Diagnosis not present

## 2024-01-04 LAB — CBC WITH DIFFERENTIAL/PLATELET
Abs Immature Granulocytes: 0.02 10*3/uL (ref 0.00–0.07)
Basophils Absolute: 0 10*3/uL (ref 0.0–0.1)
Basophils Relative: 0 %
Eosinophils Absolute: 0 10*3/uL (ref 0.0–0.5)
Eosinophils Relative: 0 %
HCT: 48.6 % — ABNORMAL HIGH (ref 36.0–46.0)
Hemoglobin: 16.2 g/dL — ABNORMAL HIGH (ref 12.0–15.0)
Immature Granulocytes: 0 %
Lymphocytes Relative: 25 %
Lymphs Abs: 1.8 10*3/uL (ref 0.7–4.0)
MCH: 34.5 pg — ABNORMAL HIGH (ref 26.0–34.0)
MCHC: 33.3 g/dL (ref 30.0–36.0)
MCV: 103.6 fL — ABNORMAL HIGH (ref 80.0–100.0)
Monocytes Absolute: 0.5 10*3/uL (ref 0.1–1.0)
Monocytes Relative: 7 %
Neutro Abs: 4.8 10*3/uL (ref 1.7–7.7)
Neutrophils Relative %: 68 %
Platelets: 147 10*3/uL — ABNORMAL LOW (ref 150–400)
RBC: 4.69 MIL/uL (ref 3.87–5.11)
RDW: 13 % (ref 11.5–15.5)
WBC: 7.2 10*3/uL (ref 4.0–10.5)
nRBC: 0 % (ref 0.0–0.2)

## 2024-01-04 LAB — COMPREHENSIVE METABOLIC PANEL
ALT: 18 U/L (ref 0–44)
AST: 18 U/L (ref 15–41)
Albumin: 3.8 g/dL (ref 3.5–5.0)
Alkaline Phosphatase: 64 U/L (ref 38–126)
Anion gap: 10 (ref 5–15)
BUN: 13 mg/dL (ref 6–20)
CO2: 25 mmol/L (ref 22–32)
Calcium: 9.2 mg/dL (ref 8.9–10.3)
Chloride: 102 mmol/L (ref 98–111)
Creatinine, Ser: 0.69 mg/dL (ref 0.44–1.00)
GFR, Estimated: >60 mL/min (ref >60)
Glucose, Bld: 93 mg/dL (ref 70–99)
Potassium: 4 mmol/L (ref 3.5–5.1)
Sodium: 137 mmol/L (ref 135–145)
Total Bilirubin: 0.8 mg/dL (ref 0.0–1.2)
Total Protein: 6.8 g/dL (ref 6.5–8.1)

## 2024-01-04 MED ORDER — ASPIRIN 81 MG PO CHEW: 81.0000 mg | CHEWABLE_TABLET | Freq: Every day | ORAL | 0 refills | Status: AC

## 2024-01-04 MED ORDER — ALPRAZOLAM 0.5 MG PO TABS: 0.5000 mg | ORAL_TABLET | Freq: Once | ORAL | Status: DC

## 2024-01-04 MED ORDER — ASPIRIN 81 MG PO CHEW
81.0000 mg | CHEWABLE_TABLET | Freq: Once | ORAL | Status: AC
  Administered 2024-01-04: 81 mg via ORAL
  Filled 2024-01-04: qty 1

## 2024-01-04 MED ORDER — IOHEXOL 350 MG/ML SOLN
75.0000 mL | Freq: Once | INTRAVENOUS | Status: AC | PRN
  Administered 2024-01-04: 75 mL via INTRAVENOUS

## 2024-01-04 MED ORDER — HYDRALAZINE HCL 10 MG PO TABS
10.0000 mg | ORAL_TABLET | Freq: Once | ORAL | Status: AC
  Administered 2024-01-04: 10 mg via ORAL
  Filled 2024-01-04: qty 1

## 2024-01-04 MED ORDER — ASPIRIN 81 MG PO CHEW: 81.0000 mg | CHEWABLE_TABLET | Freq: Every day | ORAL | 0 refills | Status: DC

## 2024-01-04 MED ORDER — METOCLOPRAMIDE HCL 5 MG/ML IJ SOLN: 10.0000 mg | Freq: Once | INTRAMUSCULAR | Status: DC

## 2024-01-04 NOTE — Discharge Instructions (Addendum)
Thank you for the opportunity to take care of you in our Emergency Department.  You were seen in the ER for elevated blood pressure and also visual disturbance yesterday.  The workup in the emergency room including CT angiogram is reassuring.  Please start taking daily aspirin.  Please call your primary care doctor for further assessment, return to the emergency room immediately if you start having any new neurologic symptoms such as one-sided weakness, numbness, slurred speech, vision loss, double vision or dizziness.  Additionally, you have been diagnosed with high blood pressure, also known as hypertension. This means that the force of blood against the walls of your blood vessels called is too strong. It also means that your heart has to work harder to move the blood. High blood pressure usually has no symptoms, but over time, it can cause serious health problems such as Heart attack and heart failure Stroke Kidney disease and failure Vision loss With the help from your healthcare provider and some important life style changes, you can manage your blood pressure and protect your health. Please read the instructions provided on hypertension, how to manage it and how to check your blood pressure. Additionally, use the blood pressure log provided to record your blood pressures. Take the blood pressure log with you to your primary care doctor so that they can adjust your blood pressure medications if needed. Please read the instructions on follow-up appointment. Return to the ER or Call 911 right away if you have any of these symptoms: Chest pain or shortness of breath Severe headache Weakness, tingling, or numbness of your face, arms, or legs (especially on 1 side of the body) Sudden change in vision Confusion, trouble speaking, or trouble understanding speech

## 2024-01-04 NOTE — ED Provider Notes (Signed)
[place] EMERGENCY DEPARTMENT AT [facility] Provider Note   CSN: [id number] Arrival date & time: 01/04/24  1337     History  Chief Complaint  Patient presents with   Hypertension    [name] is a 58 y.o. female.  HPI    58 year old female comes in with chief complaint of elevated blood pressure. Patient has history of hypertension and hyperlipidemia.  She smokes pack and a half a day.  She states that over the last 2 weeks, she has been having episodes of headaches.  Yesterday while she was at work, she had an episode of blurry vision for 5 minutes.  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  She denies any associated episodes of dizziness, double vision, difficulty with speech or swallowing.  Patient also denies any one-sided weakness, numbness, neck pain.  She has history of chronic headache, but due to the episode yesterday, she decided to check her blood pressure.  Her BP at home was in the 180s.  Repeat blood pressure check again this morning revealed elevated blood pressure, prompting her to come to the ER.  Currently, patient has a mild frontal headache.  She has no visual disturbance.  Home Medications Prior to Admission medications   Medication Sig Start Date End Date Taking? Authorizing Provider  aspirin 81 MG chewable tablet Chew 1 tablet (81 mg total) by mouth daily. 01/04/24  Yes Derwood Kaplan, MD  ALPRAZolam Prudy Feeler) 1 MG tablet Take 0.5-1 tablets (0.5-1 mg total) by mouth 3 (three) times daily as needed for anxiety. 01/14/18   Porfirio Oar, PA  atenolol (TENORMIN) 50 MG tablet Take 1 tablet (50 mg total) by mouth every morning. 04/06/17   Porfirio Oar, PA  cephALEXin (KEFLEX) 500 MG capsule Take 1 capsule (500 mg total) by mouth 4 (four) times daily. 06/06/20   Molpus, John, MD  citalopram (CELEXA) 40 MG tablet Take 1 tablet (40 mg total) by mouth every morning. 01/18/18   Porfirio Oar, PA   lisinopril-hydrochlorothiazide (PRINZIDE,ZESTORETIC) 10-12.5 MG tablet Take 1 tablet by mouth daily. 06/01/17   Porfirio Oar, PA  omeprazole (PRILOSEC) 20 MG capsule Take 2 capsules (40 mg total) by mouth daily. 08/23/13   Ghimire, Werner Lean, MD      Allergies    Benadryl [diphenhydramine]    Review of Systems   Review of Systems  All other systems reviewed and are negative.   Physical Exam Updated Vital Signs BP (!) 189/83   Pulse (!) 59   Temp 97.8 F (36.6 C) (Oral)   Resp 16   Ht 5\' 8"  (1.727 m)   Wt 90.7 kg   SpO2 99%   BMI 30.41 kg/m  Physical Exam Vitals and nursing note reviewed.  Constitutional:      Appearance: She is well-developed.  HENT:     Head: Normocephalic and atraumatic.  Eyes:     Extraocular Movements: Extraocular movements intact.     Pupils: Pupils are equal, round, and reactive to light.     Comments: Visual fields are intact  Cardiovascular:     Rate and Rhythm: Normal rate.  Pulmonary:     Effort: Pulmonary effort is normal.  Musculoskeletal:     Cervical back: Normal range of motion and neck supple.  Skin:    General: Skin is dry.  Neurological:     Mental Status: She is alert and oriented to person, place, and time.     Cranial  Nerves: No cranial nerve deficit.     Sensory: No sensory deficit.     Motor: No weakness.     Coordination: Coordination normal.     ED Results / Procedures / Treatments   Labs (all labs ordered are listed, but only abnormal results are displayed) Labs Reviewed  CBC WITH DIFFERENTIAL/PLATELET - Abnormal; Notable for the following components:      Result Value   Hemoglobin 16.2 (*)    HCT 48.6 (*)    MCV 103.6 (*)    MCH 34.5 (*)    Platelets 147 (*)    All other components within normal limits  COMPREHENSIVE METABOLIC PANEL    EKG EKG Interpretation Date/Time:  Tuesday January 04 2024 14:50:33 EST Ventricular Rate:  62 PR Interval:  171 QRS Duration:  87 QT Interval:  422 QTC  Calculation: 429 R Axis:   64  Text Interpretation: Sinus rhythm No significant change since last tracing Confirmed by Linwood Dibbles [phone] on 01/04/2024 2:55:49 PM  Radiology CT ANGIO HEAD NECK W WO CM Result Date: 01/04/2024 CLINICAL DATA:  Stroke/TIA, determine embolic source EXAM: CT ANGIOGRAPHY HEAD AND NECK WITH AND WITHOUT CONTRAST TECHNIQUE: Multidetector CT imaging of the head and neck was performed using the standard protocol during bolus administration of intravenous contrast. Multiplanar CT image reconstructions and MIPs were obtained to evaluate the vascular anatomy. Carotid stenosis measurements (when applicable) are obtained utilizing NASCET criteria, using the distal internal carotid diameter as the denominator. RADIATION DOSE REDUCTION: This exam was performed according to the departmental dose-optimization program which includes automated exposure control, adjustment of the mA and/or kV according to patient size and/or use of iterative reconstruction technique. CONTRAST:  75mL OMNIPAQUE IOHEXOL 350 MG/ML SOLN COMPARISON:  None Available. FINDINGS: CT HEAD FINDINGS Brain: No evidence of acute infarction, hemorrhage, hydrocephalus, extra-axial collection or mass lesion/mass effect. Vascular: No hyperdense vessel. Skull: No acute fracture. Sinuses/Orbits: Clear sinuses.  No acute orbital findings. Review of the MIP images confirms the above findings CTA NECK FINDINGS Aortic arch: Aortic atherosclerosis. Great vessel origins are patent without significant stenosis. Right carotid system: No evidence of dissection, stenosis (50% or greater), or occlusion. Left carotid system: No evidence of dissection, stenosis (50% or greater), or occlusion. Vertebral arteries: Codominant. No evidence of dissection, stenosis (50% or greater), or occlusion. Skeleton: No acute fracture.  C6-C7 degenerative disc disease. Other neck: No acute abnormality on limited assessment. Upper chest: Is visualized lung apices are  clear. Review of the MIP images confirms the above findings CTA HEAD FINDINGS Anterior circulation: Bilateral intracranial ICAs, MCAs, and ACAs are patent without proximal hemodynamically significant stenosis. Posterior circulation: Bilateral intradural vertebral arteries, basilar artery and bilateral posterior cerebral arteries are patent without proximally hemodynamically significant stenosis. Venous sinuses: As permitted by contrast timing, patent. Review of the MIP images confirms the above findings IMPRESSION: 1. No emergent large vessel occlusion or proximal hemodynamically significant stenosis. 2. No evidence of acute intracranial abnormality. 3. Aortic Atherosclerosis (ICD10-I70.0). Electronically Signed   By: Feliberto Harts M.D.   On: 01/04/2024 20:01    Procedures Procedures    Medications Ordered in ED Medications  metoCLOPramide (REGLAN) injection 10 mg (10 mg Intravenous Not Given 01/04/24 2030)  ALPRAZolam Prudy Feeler) tablet 0.5 mg (0.5 mg Oral Not Given 01/04/24 1835)  hydrALAZINE (APRESOLINE) tablet 10 mg (10 mg Oral Given 01/04/24 1459)  iohexol (OMNIPAQUE) 350 MG/ML injection 75 mL (75 mLs Intravenous Contrast Given 01/04/24 1856)  aspirin chewable tablet 81 mg (81 mg Oral Given  01/04/24 2033)    ED Course/ Medical Decision Making/ A&P       ABCD2 Score: 1                         Medical Decision Making Amount and/or Complexity of Data Reviewed Radiology: ordered.  Risk OTC drugs. Prescription drug management.   This patient presents to the ED with chief complaint(s) of elevated blood pressure with episodic headaches for the last 2 months and singular episode of 4-minute blurry vision yesterday with pertinent past medical history of hypertension, hyperlipidemia, tobacco use disorder.The complaint involves an extensive differential diagnosis and also carries with it a high risk of complications and morbidity.    The differential diagnosis includes : Hypertensive emergency,  asymptomatic marked elevated blood pressure, hypertensive headaches, TIA.  The initial plan is to get basic labs.  Patient's ABCD 2 score is 1.  What is concerning is her extensive smoking history, as she has 60+ pack year smoking history.  Her neuroexam is nonfocal at this time.  We will get CT angiogram head and neck to not only rule out brain bleed in the setting of patient having headaches with elevated blood pressure but also rule out significant plaque buildup or dissection.   Additional history obtained: Records reviewed  previous records including patient's previous blood pressures and medications.  Independent labs interpretation:  The following labs were independently interpreted: CBC, metabolic profile is reassuring.  Independent visualization and interpretation of imaging: - I independently visualized the following imaging with scope of interpretation limited to determining acute life threatening conditions related to emergency care: CT angiogram head and neck, which revealed no evidence of brain bleed.  CT angiogram per radiologist does not reveal any evidence of dissection or aneurysm.  Treatment and Reassessment: The patient appears reasonably screened and/or stabilized for discharge and I doubt any other medical condition or other [facility] requiring further screening, evaluation, or treatment in the ED at this time prior to discharge.   Results from the ER workup discussed with the patient face to face and all questions answered to the best of my ability. The patient is safe for discharge with strict return precautions.  I informed patient that she will need to start taking baby aspirin every day.  She will need to follow-up with her PCP as soon as possible for BP management.  Consideration for admission or further workup: Admission considered, however workup is reassuring, patient has PCP.  Smoking cessation instruction/counseling given:  counseled patient on the dangers of  tobacco use, advised patient to stop smoking, and reviewed strategies to maximize success  Final Clinical Impression(s) / ED Diagnoses Final diagnoses:  Uncontrolled hypertension  Visual disturbance    Rx / DC Orders ED Discharge Orders          Ordered    aspirin 81 MG chewable tablet  Daily        01/04/24 2030              Derwood Kaplan, MD 01/04/24 2037

## 2024-01-04 NOTE — ED Triage Notes (Signed)
Pt reports elevated BP since last night; does not monitor it daily; had an episode of blurry vision at work last night; slight HA at this time; takes meds as directed; denies visual disturbance at this time

## 2024-01-04 NOTE — ED Provider Triage Note (Signed)
Emergency Medicine Provider Triage Evaluation Note  RAMANI RIVA , a 58 y.o. female  was evaluated in triage.  Pt complains of HTN. Reports checked BP last night and noticed it was high. Had one episode of blurred vision at work last evening.  Currently complaining of 4/10 HA (forehead and top of head)  Takes atenolol and lisinopril for HTN. Hasn't missed doses. Has appointment with PCP tomorrow  Review of Systems  Positive: HTN, HA Negative: Cp, sob  Physical Exam  BP (!) 185/105 (BP Location: Left Arm)   Pulse 70   Temp 97.7 F (36.5 C)   Resp 18   Ht 5\' 8"  (1.727 m)   Wt 90.7 kg   SpO2 97%   BMI 30.41 kg/m  Gen:   Awake, no distress   Resp:  Normal effort  MSK:   Moves extremities without difficulty  Other:  CN intact. Gait normal. No paresthesia. Neuro intact.  Medical Decision Making  Medically screening exam initiated at 2:45 PM.  Appropriate orders placed.  Maybel L Feuerstein was informed that the remainder of the evaluation will be completed by another provider, this initial triage assessment does not replace that evaluation, and the importance of remaining in the ED until their evaluation is complete.  Workup started. Hydralazine provided   Judithann Sheen, PA 01/04/24 1453
